# Patient Record
Sex: Male | Born: 1935 | Race: White | Hispanic: No | Smoking: Current every day smoker
Health system: Southern US, Community
[De-identification: ages and names within clinical notes are randomized; demographics above are authoritative.]

## PROBLEM LIST (undated history)

## (undated) DIAGNOSIS — J449 Chronic obstructive pulmonary disease, unspecified: Secondary | ICD-10-CM

## (undated) DIAGNOSIS — I4891 Unspecified atrial fibrillation: Secondary | ICD-10-CM

## (undated) DIAGNOSIS — I712 Thoracic aortic aneurysm, without rupture, unspecified: Secondary | ICD-10-CM

## (undated) DIAGNOSIS — I10 Essential (primary) hypertension: Secondary | ICD-10-CM

## (undated) DIAGNOSIS — R55 Syncope and collapse: Secondary | ICD-10-CM

## (undated) DIAGNOSIS — E119 Type 2 diabetes mellitus without complications: Secondary | ICD-10-CM

## (undated) DIAGNOSIS — R310 Gross hematuria: Secondary | ICD-10-CM

## (undated) DIAGNOSIS — H0012 Chalazion right lower eyelid: Secondary | ICD-10-CM

## (undated) DIAGNOSIS — I714 Abdominal aortic aneurysm, without rupture, unspecified: Secondary | ICD-10-CM

## (undated) HISTORY — DX: Abdominal aortic aneurysm, without rupture, unspecified: I71.40

## (undated) HISTORY — PX: HERNIA REPAIR: SHX51

## (undated) HISTORY — DX: Chronic obstructive pulmonary disease, unspecified: J44.9

## (undated) HISTORY — DX: Essential (primary) hypertension: I10

## (undated) HISTORY — DX: Type 2 diabetes mellitus without complications: E11.9

## (undated) HISTORY — DX: Gross hematuria: R31.0

## (undated) HISTORY — DX: Syncope and collapse: R55

## (undated) HISTORY — DX: Thoracic aortic aneurysm, without rupture, unspecified: I71.20

## (undated) HISTORY — DX: Thoracic aortic aneurysm, without rupture: I71.2

## (undated) HISTORY — DX: Unspecified atrial fibrillation: I48.91

## (undated) HISTORY — PX: KNEE ARTHROSCOPY: SUR90

## (undated) HISTORY — DX: Chalazion right lower eyelid: H00.12

## (undated) HISTORY — DX: Abdominal aortic aneurysm, without rupture: I71.4

---

## 1999-06-19 ENCOUNTER — Encounter: Admission: RE | Admit: 1999-06-19 | Discharge: 1999-06-19 | Payer: Self-pay | Admitting: Urology

## 1999-06-19 ENCOUNTER — Encounter: Payer: Self-pay | Admitting: Urology

## 2006-08-24 ENCOUNTER — Encounter: Admission: RE | Admit: 2006-08-24 | Discharge: 2006-08-24 | Payer: Self-pay | Admitting: Family Medicine

## 2006-09-05 ENCOUNTER — Encounter: Admission: RE | Admit: 2006-09-05 | Discharge: 2006-09-05 | Payer: Self-pay | Admitting: Family Medicine

## 2008-12-30 ENCOUNTER — Encounter: Admission: RE | Admit: 2008-12-30 | Discharge: 2008-12-30 | Payer: Self-pay | Admitting: Family Medicine

## 2010-05-14 ENCOUNTER — Encounter
Admission: RE | Admit: 2010-05-14 | Discharge: 2010-05-14 | Payer: Self-pay | Source: Home / Self Care | Attending: Family Medicine | Admitting: Family Medicine

## 2018-07-14 ENCOUNTER — Ambulatory Visit (INDEPENDENT_AMBULATORY_CARE_PROVIDER_SITE_OTHER): Payer: No Typology Code available for payment source | Admitting: Cardiovascular Disease

## 2018-07-14 ENCOUNTER — Encounter: Payer: Self-pay | Admitting: Cardiovascular Disease

## 2018-07-14 DIAGNOSIS — I714 Abdominal aortic aneurysm, without rupture, unspecified: Secondary | ICD-10-CM | POA: Insufficient documentation

## 2018-07-14 DIAGNOSIS — I482 Chronic atrial fibrillation, unspecified: Secondary | ICD-10-CM

## 2018-07-14 DIAGNOSIS — E782 Mixed hyperlipidemia: Secondary | ICD-10-CM | POA: Diagnosis not present

## 2018-07-14 DIAGNOSIS — Z720 Tobacco use: Secondary | ICD-10-CM

## 2018-07-14 DIAGNOSIS — I10 Essential (primary) hypertension: Secondary | ICD-10-CM | POA: Diagnosis not present

## 2018-07-14 DIAGNOSIS — E785 Hyperlipidemia, unspecified: Secondary | ICD-10-CM | POA: Insufficient documentation

## 2018-07-14 DIAGNOSIS — J449 Chronic obstructive pulmonary disease, unspecified: Secondary | ICD-10-CM | POA: Insufficient documentation

## 2018-07-14 NOTE — Patient Instructions (Addendum)
Medication Instructions:  Your physician recommends that you continue on your current medications as directed. Please refer to the Current Medication list given to you today.  If you need a refill on your cardiac medications before your next appointment, please call your pharmacy.   Lab work: NONE If you have labs (blood work) drawn today and your tests are completely normal, you will receive your results only by: Marland Kitchen MyChart Message (if you have MyChart) OR . A paper copy in the mail If you have any lab test that is abnormal or we need to change your treatment, we will call you to review the results.  Testing/Procedures: Your physician has recommended Non-Cardiac CT Angiography (CTA) which is a special type of CT scan that uses a computer to produce multi-dimensional views of major blood vessels throughout the body. In CT angiography, a contrast material is injected through an IV to help visualize the blood vessels. This scan will be used to visualize your Abdominal Aortic Aneurysm.   SCHEDULE December 2020  Follow-Up: At [facility], you and your health needs are our priority.  As part of our continuing mission to provide you with exceptional heart care, we have created designated Provider Care Teams.  These Care Teams include your primary Cardiologist (physician) and Advanced Practice Providers (APPs -  Physician Assistants and Nurse Practitioners) who all work together to provide you with the care you need, when you need it. . You will need a follow up appointment in 12 months.  Please call our office 2 months in advance to schedule this appointment.  You may see Dr. Gwenlyn Found or one of the following Advanced Practice Providers on your designated Care Team:   . Kerin Ransom, [place] . Almyra Deforest, PA-C . Fabian Sharp, PA-C . Jory Sims, DNP . Rosaria Ferries, PA-C . Roby Lofts, PA-C . Sande Rives, PA-C

## 2018-07-14 NOTE — Assessment & Plan Note (Signed)
History of hyperlipidemia on statin therapy with lipid profile performed by the [facility] 06/27/2018 revealing a total cholesterol 131, LDL 56 and HDL of 59.

## 2018-07-14 NOTE — Assessment & Plan Note (Signed)
History of abdominal aortic aneurysm by recent CT performed by his urologist 04/24/2018 measuring 4.5 x 4.2 cm.  This is not at the size where rupture is an issue but does require annual duplex ultrasound for follow-up.  He would potentially be amenable to endoluminal stent grafting if he got to the size that was appropriate.

## 2018-07-14 NOTE — Assessment & Plan Note (Signed)
Ongoing tobacco abuse of 1 pack/day for the last 70 years

## 2018-07-14 NOTE — Progress Notes (Signed)
07/14/2018 Phillip West   07-Jul-1929  [id number]  Primary Physician [name], MD Primary Cardiologist: Lorretta Harp MD Lupe Carney, [place]  HPI:  Phillip West is a 85 y.o. thin appearing widowed Caucasian male father of 1 son Phillip West, who is my patient as well) who was referred by the [facility] Windy Fast, MD) for cardiovascular evaluation because of an abdominal aortic aneurysm and A. fib.  He is retired from [employer] as well as the [employer] system.  His risk factors include 70 pack years of tobacco abuse currently smoking 1 pack/day, treated hypertension and hyperlipidemia.  There is no family history for heart disease.  He is never had a heart attack or stroke.  He denies chest pain but does get short of breath both at rest and with exertion probably related to COPD.  He just recently was noted to be in A. fib as well.  A CT scan performed 04/24/2018 by his urologist revealed an infrarenal abdominal aortic aneurysm measuring 4.5 x 4.2 cm.   Current Meds  Medication Sig  . aspirin EC 81 MG tablet Take 81 mg by mouth daily.  . Cholecalciferol (VITAMIN D) 50 MCG (2000 UT) tablet Take 1 tablet by mouth daily.  Marland Kitchen lisinopril (PRINIVIL,ZESTRIL) 5 MG tablet Take 1 tablet by mouth daily.  . LUBRICATING PLUS EYE DROPS 0.5 % SOLN Place 1 drop into both eyes daily.  . pravastatin (PRAVACHOL) 40 MG tablet Take 1 tablet by mouth daily.  . SYMBICORT 160-4.5 MCG/ACT inhaler Inhale 2 puffs into the lungs 2 (two) times daily.  . tamsulosin (FLOMAX) 0.4 MG CAPS capsule Take 0.4 mg by mouth daily.     No Known Allergies  Social History   Socioeconomic History  . Marital status: Widowed    Spouse name: Not on file  . Number of children: Not on file  . Years of education: Not on file  . Highest education level: Not on file  Occupational History  . Not on file  Social Needs  . Financial resource strain: Not on file    . Food insecurity:    Worry: Not on file    Inability: Not on file  . Transportation needs:    Medical: Not on file    Non-medical: Not on file  Tobacco Use  . Smoking status: Current Some Day Smoker    Packs/day: 1.00    Types: Cigarettes    Start date: 50  . Smokeless tobacco: Never Used  Substance and Sexual Activity  . Alcohol use: Not on file  . Drug use: Not on file  . Sexual activity: Not on file  Lifestyle  . Physical activity:    Days per week: Not on file    Minutes per session: Not on file  . Stress: Not on file  Relationships  . Social connections:    Talks on phone: Not on file    Gets together: Not on file    Attends religious service: Not on file    Active member of club or organization: Not on file    Attends meetings of clubs or organizations: Not on file    Relationship status: Not on file  . Intimate partner violence:    Fear of current or ex partner: Not on file    Emotionally abused: Not on file    Physically abused: Not on file    Forced sexual activity: Not on file  Other Topics Concern  . Not on file  Social History Narrative  . Not on file     Review of Systems: General: negative for chills, fever, night sweats or weight changes.  Cardiovascular: negative for chest pain, dyspnea on exertion, edema, orthopnea, palpitations, paroxysmal nocturnal dyspnea or shortness of breath Dermatological: negative for rash Respiratory: negative for cough or wheezing Urologic: negative for hematuria Abdominal: negative for nausea, vomiting, diarrhea, bright red blood per rectum, melena, or hematemesis Neurologic: negative for visual changes, syncope, or dizziness All other systems reviewed and are otherwise negative except as noted above.    Blood pressure 114/80, pulse 95, height 5' 8.5" (1.74 m), weight 158 lb 12.8 oz (72 kg).  General appearance: alert and no distress Neck: no adenopathy, no carotid bruit, no JVD, supple, symmetrical, trachea  midline and thyroid not enlarged, symmetric, no tenderness/mass/nodules Lungs: clear to auscultation bilaterally Heart: irregularly irregular rhythm Extremities: extremities normal, atraumatic, no cyanosis or edema Pulses: 2+ and symmetric Skin: Skin color, texture, turgor normal. No rashes or lesions Neurologic: Alert and oriented X 3, normal strength and tone. Normal symmetric reflexes. Normal coordination and gait  EKG atrial fibrillation with a ventricular spots of 95, left anterior fascicular block.  I personally reviewed this EKG  ASSESSMENT AND PLAN:   Essential hypertension History of essential hypertension her blood pressure measured today at 114/80.  He is on lisinopril  Hyperlipidemia History of hyperlipidemia on statin therapy with lipid profile performed by the [facility] 06/27/2018 revealing a total cholesterol 131, LDL 56 and HDL of 59.  Atrial fibrillation, chronic History of recently recognized atrial fibrillation not on oral anticoagulation at this time.  He would be a candidate for low-dose Eliquis.  He is rate controlled and is unaware that he is in A. fib.  Abdominal aortic aneurysm (AAA) ([place]) History of abdominal aortic aneurysm by recent CT performed by his urologist 04/24/2018 measuring 4.5 x 4.2 cm.  This is not at the size where rupture is an issue but does require annual duplex ultrasound for follow-up.  He would potentially be amenable to endoluminal stent grafting if he got to the size that was appropriate.  Tobacco abuse Ongoing tobacco abuse of 1 pack/day for the last 70 years  COPD (chronic obstructive pulmonary disease) (HCC) History of COPD on Symbicort with chronic shortness of breath at rest and dyspnea on exertion.  He is recalcitrant risk factor modification.      Lorretta Harp MD FACP,FACC,FAHA, [facility] 07/14/2018 9:09 AM

## 2018-07-14 NOTE — Assessment & Plan Note (Signed)
History of essential hypertension her blood pressure measured today at 114/80.  He is on lisinopril

## 2018-07-14 NOTE — Assessment & Plan Note (Signed)
History of recently recognized atrial fibrillation not on oral anticoagulation at this time.  He would be a candidate for low-dose Eliquis.  He is rate controlled and is unaware that he is in A. fib.

## 2018-07-14 NOTE — Assessment & Plan Note (Signed)
History of COPD on Symbicort with chronic shortness of breath at rest and dyspnea on exertion.  He is recalcitrant risk factor modification.

## 2018-07-21 ENCOUNTER — Telehealth: Payer: Self-pay | Admitting: Cardiovascular Disease

## 2018-07-21 NOTE — Telephone Encounter (Signed)
° ° °  [place] requesting  2/28 ov note - Fax [phone] Phone [phone]

## 2019-04-09 ENCOUNTER — Encounter: Payer: Self-pay | Admitting: *Deleted

## 2019-04-23 ENCOUNTER — Encounter: Payer: Non-veteran care | Admitting: Cardiothoracic Surgery

## 2019-04-23 ENCOUNTER — Other Ambulatory Visit: Payer: Medicare Other

## 2019-04-24 ENCOUNTER — Other Ambulatory Visit: Payer: Self-pay

## 2019-04-24 DIAGNOSIS — I714 Abdominal aortic aneurysm, without rupture, unspecified: Secondary | ICD-10-CM

## 2019-04-26 ENCOUNTER — Institutional Professional Consult (permissible substitution) (INDEPENDENT_AMBULATORY_CARE_PROVIDER_SITE_OTHER): Payer: No Typology Code available for payment source | Admitting: Cardiothoracic Surgery

## 2019-04-26 ENCOUNTER — Other Ambulatory Visit: Payer: Self-pay

## 2019-04-26 ENCOUNTER — Ambulatory Visit (HOSPITAL_COMMUNITY)
Admission: RE | Admit: 2019-04-26 | Discharge: 2019-04-26 | Disposition: A | Discharge status: Home / Self Care | Payer: Medicare Other | Source: Ambulatory Visit | Attending: Vascular Surgery | Admitting: Vascular Surgery

## 2019-04-26 VITALS — BP 129/78 | HR 87 | Temp 96.4°F | Resp 16 | Ht 68.5 in | Wt 150.0 lb

## 2019-04-26 DIAGNOSIS — I712 Thoracic aortic aneurysm, without rupture, unspecified: Secondary | ICD-10-CM | POA: Insufficient documentation

## 2019-04-26 DIAGNOSIS — R310 Gross hematuria: Secondary | ICD-10-CM | POA: Insufficient documentation

## 2019-04-26 DIAGNOSIS — H0012 Chalazion right lower eyelid: Secondary | ICD-10-CM | POA: Insufficient documentation

## 2019-04-26 DIAGNOSIS — I10 Essential (primary) hypertension: Secondary | ICD-10-CM | POA: Insufficient documentation

## 2019-04-26 DIAGNOSIS — E119 Type 2 diabetes mellitus without complications: Secondary | ICD-10-CM | POA: Insufficient documentation

## 2019-04-26 DIAGNOSIS — I714 Abdominal aortic aneurysm, without rupture, unspecified: Secondary | ICD-10-CM

## 2019-04-26 DIAGNOSIS — I4891 Unspecified atrial fibrillation: Secondary | ICD-10-CM | POA: Insufficient documentation
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
## 2019-04-26 NOTE — Patient Instructions (Signed)

## 2019-04-27 ENCOUNTER — Other Ambulatory Visit (HOSPITAL_COMMUNITY): Payer: Non-veteran care

## 2019-04-27 ENCOUNTER — Ambulatory Visit (INDEPENDENT_AMBULATORY_CARE_PROVIDER_SITE_OTHER): Payer: No Typology Code available for payment source | Admitting: Vascular Surgery

## 2019-04-27 ENCOUNTER — Encounter: Payer: Self-pay | Admitting: Vascular Surgery

## 2019-04-27 VITALS — BP 125/80 | HR 97 | Temp 97.6°F | Resp 20 | Ht 68.5 in | Wt 156.0 lb

## 2019-04-27 DIAGNOSIS — I714 Abdominal aortic aneurysm, without rupture, unspecified: Secondary | ICD-10-CM

## 2019-04-27 NOTE — Progress Notes (Signed)
Patient ID: Phillip West, male   DOB: May 25, 1929, 85 y.o.   MRN: [id number]  Reason for Consult: New Patient (Initial Visit)   Referred by [name], MD  Subjective:     HPI:  Phillip West is a 85 y.o. male known history of abdominal and thoracic aneurysm.  He is a current and long-term smoker with COPD and diabetes as well as hypertension.  He does have 1 son who has multiple medical problems he is unsure if he is ever been a smoker unsure if he has aneurysm disease.  No new back or abdominal pain.  No limitations to his walking at this time.  Does take aspirin a statin drug daily.  Past Medical History:  Diagnosis Date  . Abdominal aortic aneurysm (AAA) ([facility])   . Atrial fibrillation ([place])   . Chalazion right lower eyelid   . COPD (chronic obstructive pulmonary disease) ([place])   . Diabetes mellitus ([place])   . Gross hematuria   . Hypertension   . Syncope and collapse   . Thoracic aortic aneurysm (TAA) ([facility])    Family History  Problem Relation Age of Onset  . Heart disease Father   . Stroke Father    Past Surgical History:  Procedure Laterality Date  . HERNIA REPAIR    . KNEE ARTHROSCOPY      Short Social History:  Social History   Tobacco Use  . Smoking status: Current Some Day Smoker    Packs/day: 1.00    Types: Cigarettes    Start date: 63  . Smokeless tobacco: Never Used  Substance Use Topics  . Alcohol use: Not Currently    Allergies  Allergen Reactions  . Quinolones     Patient was warned about not using Cipro and similar antibiotics. Recent studies have raised concern that fluoroquinolone antibiotics could be associated with an increased risk of aortic aneurysm Fluoroquinolones have non-antimicrobial properties that might jeopardise the integrity of the extracellular matrix of the vascular wall In a  propensity score matched cohort study in [place], there was a 66% increased rate of aortic aneurysm or dissection associated with oral  fluoroquinolone use, compared wit    Current Outpatient Medications  Medication Sig Dispense Refill  . aspirin EC 81 MG tablet Take 81 mg by mouth daily.    . Cholecalciferol (VITAMIN D) 50 MCG (2000 UT) tablet Take 1 tablet by mouth daily.    Marland Kitchen lisinopril (PRINIVIL,ZESTRIL) 5 MG tablet Take 2.5 mg by mouth daily.     . LUBRICATING PLUS EYE DROPS 0.5 % SOLN Place 1 drop into both eyes daily.    . pravastatin (PRAVACHOL) 40 MG tablet Take 1 tablet by mouth daily.    . SYMBICORT 160-4.5 MCG/ACT inhaler Inhale 2 puffs into the lungs 2 (two) times daily.    . tamsulosin (FLOMAX) 0.4 MG CAPS capsule Take 0.4 mg by mouth daily.     No current facility-administered medications for this visit.    Review of Systems  Constitutional:  Constitutional negative. HENT: HENT negative.  Eyes: Eyes negative.  Respiratory: Respiratory negative.  Cardiovascular: Cardiovascular negative.  GI: Gastrointestinal negative.  Musculoskeletal: Musculoskeletal negative.  Neurological: Neurological negative. Hematologic: Hematologic/lymphatic negative.  Psychiatric: Psychiatric negative.        Objective:  Objective   Vitals:   04/27/19 0853  BP: 125/80  Pulse: 97  Resp: 20  Temp: 97.6 F (36.4 C)  SpO2: 97%  Weight: 156 lb (70.8 kg)  Height: 5' 8.5" (1.74 m)  Body mass index is 23.37 kg/m.  Physical Exam HENT:     Head: Normocephalic.     Nose: Nose normal.     Mouth/Throat:     Mouth: Mucous membranes are moist.  Eyes:     Pupils: Pupils are equal, round, and reactive to light.  Cardiovascular:     Rate and Rhythm: Normal rate.     Pulses:          Carotid pulses are 2+ on the right side and 2+ on the left side.      Femoral pulses are 2+ on the right side and 2+ on the left side.      Popliteal pulses are 3+ on the right side and 3+ on the left side.       Dorsalis pedis pulses are 2+ on the right side and 2+ on the left side.  Pulmonary:     Effort: Pulmonary effort is normal.   Abdominal:     Palpations: Abdomen is soft.  Musculoskeletal:        General: No swelling. Normal range of motion.  Skin:    General: Skin is warm and dry.     Capillary Refill: Capillary refill takes less than 2 seconds.  Neurological:     General: No focal deficit present.     Mental Status: He is alert.  Psychiatric:        Mood and Affect: Mood normal.        Behavior: Behavior normal.        Thought Content: Thought content normal.        Judgment: Judgment normal.     Data: I have reviewed his aortic duplex from yesterday which demonstrates 4.4 cm infrarenal aneurysm.     Assessment/Plan:     85 year old male with 4.4 cm infrarenal aneurysm.  He appropriately would not want any procedures unless absolutely necessary and I discussed the need for consideration of repair 5.5 cm.  At current size we can follow-up yearly with duplex ultrasound.  I will also obtain duplex of his popliteal arteries given the increase pulsatility found bilaterally without symptoms in an 85 year old male.  States that he will continue to smoke although I counseled him on this today.  He continues on aspirin and statin.     Waynetta Sandy MD [facility]

## 2019-05-01 ENCOUNTER — Other Ambulatory Visit: Payer: Self-pay

## 2019-05-02 ENCOUNTER — Other Ambulatory Visit: Payer: Self-pay | Admitting: Cardiothoracic Surgery

## 2019-05-02 DIAGNOSIS — I712 Thoracic aortic aneurysm, without rupture, unspecified: Secondary | ICD-10-CM

## 2019-05-09 ENCOUNTER — Ambulatory Visit
Admission: RE | Admit: 2019-05-09 | Discharge: 2019-05-09 | Disposition: A | Discharge status: Home / Self Care | Payer: Self-pay | Source: Ambulatory Visit | Attending: Cardiothoracic Surgery | Admitting: Cardiothoracic Surgery

## 2019-05-09 ENCOUNTER — Other Ambulatory Visit (HOSPITAL_COMMUNITY): Payer: Self-pay | Admitting: Cardiothoracic Surgery

## 2019-05-09 DIAGNOSIS — Z01818 Encounter for other preprocedural examination: Secondary | ICD-10-CM

## 2019-05-16 ENCOUNTER — Other Ambulatory Visit: Payer: Self-pay | Admitting: *Deleted

## 2019-05-16 DIAGNOSIS — I714 Abdominal aortic aneurysm, without rupture, unspecified: Secondary | ICD-10-CM

## 2019-06-14 ENCOUNTER — Other Ambulatory Visit: Payer: Self-pay | Admitting: Cardiothoracic Surgery

## 2019-06-14 DIAGNOSIS — I712 Thoracic aortic aneurysm, without rupture, unspecified: Secondary | ICD-10-CM

## 2019-06-15 LAB — CREATININE, SERUM: Creat: 0.87 mg/dL (ref 0.70–1.11)

## 2019-06-21 ENCOUNTER — Ambulatory Visit
Admission: RE | Admit: 2019-06-21 | Discharge: 2019-06-21 | Disposition: A | Discharge status: Home / Self Care | Payer: Medicare Other | Source: Ambulatory Visit | Attending: Cardiothoracic Surgery | Admitting: Cardiothoracic Surgery

## 2019-06-21 ENCOUNTER — Ambulatory Visit: Payer: No Typology Code available for payment source | Admitting: Cardiothoracic Surgery

## 2019-06-21 DIAGNOSIS — I712 Thoracic aortic aneurysm, without rupture, unspecified: Secondary | ICD-10-CM

## 2019-06-21 MED ORDER — IOPAMIDOL (ISOVUE-370) INJECTION 76%
75.0000 mL | Freq: Once | INTRAVENOUS | Status: AC | PRN
  Administered 2019-06-21: 75 mL via INTRAVENOUS
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
## 2019-06-22 ENCOUNTER — Ambulatory Visit (INDEPENDENT_AMBULATORY_CARE_PROVIDER_SITE_OTHER): Payer: No Typology Code available for payment source | Admitting: Cardiothoracic Surgery

## 2019-06-22 ENCOUNTER — Encounter: Payer: Self-pay | Admitting: Cardiothoracic Surgery

## 2019-06-22 ENCOUNTER — Other Ambulatory Visit: Payer: Self-pay

## 2019-06-22 VITALS — BP 114/65 | HR 80 | Resp 20 | Ht 68.5 in | Wt 155.0 lb

## 2019-06-22 DIAGNOSIS — I714 Abdominal aortic aneurysm, without rupture, unspecified: Secondary | ICD-10-CM

## 2019-06-22 DIAGNOSIS — I712 Thoracic aortic aneurysm, without rupture, unspecified: Secondary | ICD-10-CM
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
## 2019-06-22 NOTE — Patient Instructions (Signed)

## 2019-09-18 ENCOUNTER — Other Ambulatory Visit: Payer: Self-pay

## 2019-09-18 ENCOUNTER — Ambulatory Visit (INDEPENDENT_AMBULATORY_CARE_PROVIDER_SITE_OTHER): Payer: No Typology Code available for payment source | Admitting: Cardiothoracic Surgery

## 2019-09-18 ENCOUNTER — Encounter: Payer: Self-pay | Admitting: Cardiothoracic Surgery

## 2019-09-18 VITALS — BP 114/62 | HR 98 | Temp 98.2°F | Resp 18 | Ht 68.5 in | Wt 153.8 lb

## 2019-09-18 DIAGNOSIS — I712 Thoracic aortic aneurysm, without rupture, unspecified: Secondary | ICD-10-CM
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
## 2020-02-06 ENCOUNTER — Other Ambulatory Visit: Payer: Self-pay | Admitting: Cardiothoracic Surgery

## 2020-02-06 DIAGNOSIS — I712 Thoracic aortic aneurysm, without rupture, unspecified: Secondary | ICD-10-CM

## 2020-03-20 ENCOUNTER — Other Ambulatory Visit: Payer: No Typology Code available for payment source

## 2020-03-20 ENCOUNTER — Ambulatory Visit: Payer: No Typology Code available for payment source | Admitting: Cardiothoracic Surgery

## 2020-03-27 ENCOUNTER — Encounter: Payer: Self-pay | Admitting: Cardiothoracic Surgery

## 2020-03-27 ENCOUNTER — Ambulatory Visit (INDEPENDENT_AMBULATORY_CARE_PROVIDER_SITE_OTHER): Payer: No Typology Code available for payment source | Admitting: Cardiothoracic Surgery

## 2020-03-27 ENCOUNTER — Ambulatory Visit
Admission: RE | Admit: 2020-03-27 | Discharge: 2020-03-27 | Disposition: A | Discharge status: Home / Self Care | Payer: Medicare Other | Source: Ambulatory Visit | Attending: Cardiothoracic Surgery | Admitting: Cardiothoracic Surgery

## 2020-03-27 ENCOUNTER — Other Ambulatory Visit: Payer: Self-pay

## 2020-03-27 VITALS — BP 124/68 | HR 74 | Resp 18 | Wt 157.6 lb

## 2020-03-27 DIAGNOSIS — I712 Thoracic aortic aneurysm, without rupture, unspecified: Secondary | ICD-10-CM

## 2020-03-27 MED ORDER — IOPAMIDOL (ISOVUE-370) INJECTION 76%
75.0000 mL | Freq: Once | INTRAVENOUS | Status: AC | PRN
  Administered 2020-03-27: 75 mL via INTRAVENOUS
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
## 2020-04-25 ENCOUNTER — Emergency Department (HOSPITAL_COMMUNITY): Payer: No Typology Code available for payment source

## 2020-04-25 ENCOUNTER — Other Ambulatory Visit: Payer: Self-pay

## 2020-04-25 ENCOUNTER — Inpatient Hospital Stay (HOSPITAL_COMMUNITY)
Admission: EM | Admit: 2020-04-25 | Discharge: 2020-05-01 | DRG: 481 | Disposition: A | Discharge status: Rehabilitation Facility | Payer: No Typology Code available for payment source | Attending: Internal Medicine | Admitting: Internal Medicine

## 2020-04-25 DIAGNOSIS — S72142A Displaced intertrochanteric fracture of left femur, initial encounter for closed fracture: Secondary | ICD-10-CM | POA: Diagnosis not present

## 2020-04-25 DIAGNOSIS — D7589 Other specified diseases of blood and blood-forming organs: Secondary | ICD-10-CM | POA: Diagnosis present

## 2020-04-25 DIAGNOSIS — Z720 Tobacco use: Secondary | ICD-10-CM | POA: Diagnosis present

## 2020-04-25 DIAGNOSIS — Z20822 Contact with and (suspected) exposure to covid-19: Secondary | ICD-10-CM | POA: Diagnosis present

## 2020-04-25 DIAGNOSIS — K5900 Constipation, unspecified: Secondary | ICD-10-CM | POA: Diagnosis present

## 2020-04-25 DIAGNOSIS — Z7951 Long term (current) use of inhaled steroids: Secondary | ICD-10-CM

## 2020-04-25 DIAGNOSIS — S72002A Fracture of unspecified part of neck of left femur, initial encounter for closed fracture: Secondary | ICD-10-CM

## 2020-04-25 DIAGNOSIS — W010XXA Fall on same level from slipping, tripping and stumbling without subsequent striking against object, initial encounter: Secondary | ICD-10-CM | POA: Diagnosis present

## 2020-04-25 DIAGNOSIS — Z79899 Other long term (current) drug therapy: Secondary | ICD-10-CM

## 2020-04-25 DIAGNOSIS — J449 Chronic obstructive pulmonary disease, unspecified: Secondary | ICD-10-CM | POA: Diagnosis present

## 2020-04-25 DIAGNOSIS — I700 Atherosclerosis of aorta: Secondary | ICD-10-CM | POA: Diagnosis present

## 2020-04-25 DIAGNOSIS — N281 Cyst of kidney, acquired: Secondary | ICD-10-CM | POA: Diagnosis present

## 2020-04-25 DIAGNOSIS — I482 Chronic atrial fibrillation, unspecified: Secondary | ICD-10-CM | POA: Diagnosis not present

## 2020-04-25 DIAGNOSIS — Z7982 Long term (current) use of aspirin: Secondary | ICD-10-CM

## 2020-04-25 DIAGNOSIS — I714 Abdominal aortic aneurysm, without rupture: Secondary | ICD-10-CM | POA: Diagnosis present

## 2020-04-25 DIAGNOSIS — R310 Gross hematuria: Secondary | ICD-10-CM | POA: Diagnosis present

## 2020-04-25 DIAGNOSIS — D72829 Elevated white blood cell count, unspecified: Secondary | ICD-10-CM | POA: Diagnosis not present

## 2020-04-25 DIAGNOSIS — Z8249 Family history of ischemic heart disease and other diseases of the circulatory system: Secondary | ICD-10-CM | POA: Diagnosis not present

## 2020-04-25 DIAGNOSIS — I2510 Atherosclerotic heart disease of native coronary artery without angina pectoris: Secondary | ICD-10-CM | POA: Diagnosis present

## 2020-04-25 DIAGNOSIS — I951 Orthostatic hypotension: Secondary | ICD-10-CM

## 2020-04-25 DIAGNOSIS — R197 Diarrhea, unspecified: Secondary | ICD-10-CM | POA: Diagnosis present

## 2020-04-25 DIAGNOSIS — I4891 Unspecified atrial fibrillation: Secondary | ICD-10-CM | POA: Diagnosis not present

## 2020-04-25 DIAGNOSIS — F17210 Nicotine dependence, cigarettes, uncomplicated: Secondary | ICD-10-CM | POA: Diagnosis present

## 2020-04-25 DIAGNOSIS — I712 Thoracic aortic aneurysm, without rupture, unspecified: Secondary | ICD-10-CM | POA: Diagnosis present

## 2020-04-25 DIAGNOSIS — I4821 Permanent atrial fibrillation: Secondary | ICD-10-CM | POA: Diagnosis not present

## 2020-04-25 DIAGNOSIS — T148XXA Other injury of unspecified body region, initial encounter: Secondary | ICD-10-CM

## 2020-04-25 DIAGNOSIS — Y92017 Garden or yard in single-family (private) house as the place of occurrence of the external cause: Secondary | ICD-10-CM

## 2020-04-25 DIAGNOSIS — S7292XA Unspecified fracture of left femur, initial encounter for closed fracture: Secondary | ICD-10-CM | POA: Diagnosis present

## 2020-04-25 DIAGNOSIS — I10 Essential (primary) hypertension: Secondary | ICD-10-CM | POA: Diagnosis not present

## 2020-04-25 DIAGNOSIS — Z823 Family history of stroke: Secondary | ICD-10-CM | POA: Diagnosis not present

## 2020-04-25 DIAGNOSIS — W19XXXA Unspecified fall, initial encounter: Secondary | ICD-10-CM

## 2020-04-25 DIAGNOSIS — Z881 Allergy status to other antibiotic agents status: Secondary | ICD-10-CM | POA: Diagnosis not present

## 2020-04-25 DIAGNOSIS — E785 Hyperlipidemia, unspecified: Secondary | ICD-10-CM | POA: Diagnosis present

## 2020-04-25 DIAGNOSIS — E119 Type 2 diabetes mellitus without complications: Secondary | ICD-10-CM | POA: Diagnosis present

## 2020-04-25 LAB — BASIC METABOLIC PANEL
Anion gap: 9 (ref 5–15)
BUN: 13 mg/dL (ref 8–23)
CO2: 24 mmol/L (ref 22–32)
Calcium: 9.4 mg/dL (ref 8.9–10.3)
Chloride: 105 mmol/L (ref 98–111)
Creatinine, Ser: 0.88 mg/dL (ref 0.61–1.24)
GFR, Estimated: >60 mL/min (ref >60)
Glucose, Bld: 112 mg/dL — ABNORMAL HIGH (ref 70–99)
Potassium: 4.3 mmol/L (ref 3.5–5.1)
Sodium: 138 mmol/L (ref 135–145)

## 2020-04-25 LAB — HEMOGLOBIN A1C
Hgb A1c MFr Bld: 6.4 % — ABNORMAL HIGH (ref 4.8–5.6)
Mean Plasma Glucose: 136.98 mg/dL

## 2020-04-25 LAB — PROTIME-INR
INR: 1.1 (ref 0.8–1.2)
Prothrombin Time: 13.9 seconds (ref 11.4–15.2)

## 2020-04-25 LAB — CBC WITH DIFFERENTIAL/PLATELET
Abs Immature Granulocytes: 0.07 10*3/uL (ref 0.00–0.07)
Basophils Absolute: 0.1 10*3/uL (ref 0.0–0.1)
Basophils Relative: 1 %
Eosinophils Absolute: 0.2 10*3/uL (ref 0.0–0.5)
Eosinophils Relative: 2 %
HCT: 42 % (ref 39.0–52.0)
Hemoglobin: 13.1 g/dL (ref 13.0–17.0)
Immature Granulocytes: 1 %
Lymphocytes Relative: 13 %
Lymphs Abs: 1.3 10*3/uL (ref 0.7–4.0)
MCH: 31.7 pg (ref 26.0–34.0)
MCHC: 31.2 g/dL (ref 30.0–36.0)
MCV: 101.7 fL — ABNORMAL HIGH (ref 80.0–100.0)
Monocytes Absolute: 0.9 10*3/uL (ref 0.1–1.0)
Monocytes Relative: 9 %
Neutro Abs: 7.7 10*3/uL (ref 1.7–7.7)
Neutrophils Relative %: 74 %
Platelets: 123 10*3/uL — ABNORMAL LOW (ref 150–400)
RBC: 4.13 MIL/uL — ABNORMAL LOW (ref 4.22–5.81)
RDW: 13.7 % (ref 11.5–15.5)
WBC: 10.2 10*3/uL (ref 4.0–10.5)
nRBC: 0 % (ref 0.0–0.2)

## 2020-04-25 LAB — CBG MONITORING, ED: Glucose-Capillary: 112 mg/dL — ABNORMAL HIGH (ref 70–99)

## 2020-04-25 LAB — RESP PANEL BY RT-PCR (FLU A&B, COVID) ARPGX2
Influenza A by PCR: NEGATIVE
Influenza B by PCR: NEGATIVE
SARS Coronavirus 2 by RT PCR: NEGATIVE

## 2020-04-25 LAB — ABO/RH: ABO/RH(D): A POS

## 2020-04-25 LAB — TYPE AND SCREEN
ABO/RH(D): A POS
Antibody Screen: NEGATIVE

## 2020-04-25 MED ORDER — INSULIN ASPART 100 UNIT/ML ~~LOC~~ SOLN
0.0000 [IU] | Freq: Three times a day (TID) | SUBCUTANEOUS | Status: DC
  Administered 2020-04-26: 17:00:00 1 [IU] via SUBCUTANEOUS
  Administered 2020-04-27 – 2020-04-28 (×2): 2 [IU] via SUBCUTANEOUS
  Administered 2020-04-29: 09:00:00 1 [IU] via SUBCUTANEOUS
  Administered 2020-04-29 – 2020-04-30 (×2): 2 [IU] via SUBCUTANEOUS

## 2020-04-25 MED ORDER — CARBOXYMETHYLCELLULOSE SOD PF 0.5 % OP SOLN: 1.0000 [drp] | Freq: Every day | OPHTHALMIC | Status: DC

## 2020-04-25 MED ORDER — MOMETASONE FURO-FORMOTEROL FUM 200-5 MCG/ACT IN AERO
2.0000 | INHALATION_SPRAY | Freq: Two times a day (BID) | RESPIRATORY_TRACT | Status: DC
  Administered 2020-04-26 – 2020-05-01 (×7): 2 via RESPIRATORY_TRACT
  Filled 2020-04-25 (×2): qty 8.8

## 2020-04-25 MED ORDER — ACETAMINOPHEN 325 MG PO TABS
650.0000 mg | ORAL_TABLET | Freq: Four times a day (QID) | ORAL | Status: DC | PRN
  Administered 2020-04-26 – 2020-04-27 (×3): 650 mg via ORAL
  Filled 2020-04-25 (×4): qty 2

## 2020-04-25 MED ORDER — ACETAMINOPHEN 650 MG RE SUPP: 650.0000 mg | Freq: Four times a day (QID) | RECTAL | Status: DC | PRN

## 2020-04-25 MED ORDER — METOPROLOL TARTRATE 12.5 MG HALF TABLET
12.5000 mg | ORAL_TABLET | Freq: Two times a day (BID) | ORAL | Status: DC
  Administered 2020-04-25 – 2020-04-28 (×5): 12.5 mg via ORAL
  Filled 2020-04-25 (×6): qty 1

## 2020-04-25 MED ORDER — HEPARIN SODIUM (PORCINE) 5000 UNIT/ML IJ SOLN
5000.0000 [IU] | Freq: Three times a day (TID) | INTRAMUSCULAR | Status: AC
  Administered 2020-04-25: 5000 [IU] via SUBCUTANEOUS
  Filled 2020-04-25: qty 1

## 2020-04-25 MED ORDER — HYDROMORPHONE HCL 1 MG/ML IJ SOLN
0.5000 mg | INTRAMUSCULAR | Status: DC | PRN
  Administered 2020-04-25: 0.5 mg via INTRAVENOUS
  Filled 2020-04-25: qty 1

## 2020-04-25 MED ORDER — HEPARIN SODIUM (PORCINE) 5000 UNIT/ML IJ SOLN
5000.0000 [IU] | Freq: Three times a day (TID) | INTRAMUSCULAR | Status: DC
Start: 2020-04-25 — End: 2020-04-25

## 2020-04-25 MED ORDER — SODIUM CHLORIDE 0.9% FLUSH
3.0000 mL | Freq: Two times a day (BID) | INTRAVENOUS | Status: DC
  Administered 2020-04-25 – 2020-05-01 (×10): 3 mL via INTRAVENOUS

## 2020-04-25 MED ORDER — TAMSULOSIN HCL 0.4 MG PO CAPS
0.4000 mg | ORAL_CAPSULE | Freq: Every day | ORAL | Status: DC
  Administered 2020-04-26 – 2020-04-27 (×2): 0.4 mg via ORAL
  Filled 2020-04-25 (×3): qty 1

## 2020-04-25 MED ORDER — POLYETHYLENE GLYCOL 3350 17 G PO PACK: 17.0000 g | PACK | Freq: Every day | ORAL | Status: DC | PRN

## 2020-04-25 MED ORDER — POLYVINYL ALCOHOL 1.4 % OP SOLN
1.0000 [drp] | Freq: Every day | OPHTHALMIC | Status: DC
  Administered 2020-04-27 – 2020-04-30 (×4): 1 [drp] via OPHTHALMIC
  Filled 2020-04-25: qty 15

## 2020-04-25 MED ORDER — ALBUTEROL SULFATE HFA 108 (90 BASE) MCG/ACT IN AERS
1.0000 | INHALATION_SPRAY | Freq: Four times a day (QID) | RESPIRATORY_TRACT | Status: DC | PRN
  Filled 2020-04-25: qty 6.7

## 2020-04-25 NOTE — ED Notes (Signed)
Pt resting in bed. Maintaining hip fracture precautions. Provided patient with food.

## 2020-04-25 NOTE — Progress Notes (Signed)
CT scan was completed of the left hip.  I reviewed the images and the patient has an intertrochanteric hip fracture that is amenable for cephalomedullary fixation.  I have posted for surgery tomorrow morning.  Keep patient n.p.o. past midnight please.

## 2020-04-25 NOTE — ED Triage Notes (Signed)
Pt transported from home by [facility] from after mechanical fall in his yard, pt states he was unable to get up d/t leg pain for @ 1 hr, pt unable to get in touch with his son. GCS 15, no blood thinners. No shortening or rotation

## 2020-04-25 NOTE — H&P (Signed)
Date: 04/25/2020               Patient Name:  Phillip West MRN: [id number]  DOB: 1930/01/30 Age / Sex: 86 y.o., male   PCP: Windy Fast, MD         Medical Service: Internal Medicine Teaching Service         Attending Physician: Dr. Lucious Groves, DO    First Contact: Dr. Demaris Callander Pager: [phone]  Second Contact: Dr. Charleen Kirks Pager: [phone]       After Hours (After 5p/  First Contact Pager: [phone]  weekends / holidays): Second Contact Pager: [phone]   Chief Complaint: Fall   History of Present Illness:   Phillip West is a 86 y/o male with a PMHx of thoracic and abdominal aortic aneurysms, COPD, A. Fib not on AC, T2DM who presents to the ED s/p mechanical fall.   Phillip West states he was crushing leaves with his tractor at home when he felt possibly slightly dizzy and lost his footing when stepping down his tractor steps. He slipped forwards and landed on his left hip and buttocks. Since then, he has been unable to bear any weight. Any movement above the left ankle causes significant pain. He denies any LOC or head trauma. He denies any fever, chills, N/V/D, chest pain. He notes a chronic cough and SOB that is stable and unchanged; it is something he sees his PCP for and is attributed to his continued smoking and COPD. He denies any new medication changes.   ED Course:  Patient arrived to the ED via ambulance.  On arrival, vitals showed blood pressure 124/80, pulse of 67, with oxygen saturation 97% at 20 respirations per minute.  Initial CBC showed borderline leukocytosis at 10.2, normal hemoglobin of 13.1 with mild macrocytosis at one 1.7.  Platelets are mildly low at 123.  BMP stable with no electrolyte abnormalities or renal dysfunction.  Left hip x-ray obtained shows acute fracture of the proximal left femur distal neck with comminution into the intertrochanteric segment.  Orthopedic surgery was consulted by the ED provider who recommended a CT.  CT shows acute,  impacted, and comminuted intertrochanteric left femoral fracture with partially visualized known aortic aneurysm.  Meds:  Aspirin 81 mg daily Pravastatin 40 mg daily Tamsulosin 0.4 mg  Vitamin D-3 Symbicort Metoprolol 12.5 mg BID   Allergies: Allergies as of 04/25/2020 - Review Complete 04/25/2020  Allergen Reaction Noted  . Quinolones  04/26/2019   Past Medical History:  Diagnosis Date  . Abdominal aortic aneurysm (AAA) ([place])   . Atrial fibrillation ([place])   . Chalazion right lower eyelid   . COPD (chronic obstructive pulmonary disease) ([place])   . Diabetes mellitus ([place])   . Gross hematuria   . Hypertension   . Syncope and collapse   . Thoracic aortic aneurysm (TAA) ([place])    Family History:  Family History  Problem Relation Age of Onset  . Heart disease Father   . Stroke Father    Social History:  Daily tobacco user, approximately 1 pack/day for the last 75 years.  Denies any daily alcohol use. Denies any drug use Lives in [place] with his son Retired veteran  Review of Systems: A complete ROS was negative except as per HPI.   Physical Exam: Blood pressure (!) 142/70, pulse 81, temperature 97.7 F (36.5 C), temperature source Oral, resp. rate (!) 24, height 5\' 8"  (1.727 m), weight 68 kg, SpO2 97 %.  Physical Exam  Vitals and nursing note reviewed.  Constitutional:      Appearance: He is normal weight.  HENT:     Head: Normocephalic and atraumatic.  Eyes:     Extraocular Movements: Extraocular movements intact.     Conjunctiva/sclera: Conjunctivae normal.     Pupils: Pupils are equal, round, and reactive to light.  Cardiovascular:     Rate and Rhythm: Normal rate. Rhythm irregularly irregular.     Pulses:          Dorsalis pedis pulses are 2+ on the right side and 2+ on the left side.     Heart sounds: No murmur heard.   Pulmonary:     Effort: Pulmonary effort is normal. No respiratory distress.     Breath sounds: No wheezing, rhonchi or rales.   Abdominal:     General: Bowel sounds are normal. There is no distension.     Palpations: Abdomen is soft. There is no mass.     Tenderness: There is no abdominal tenderness. There is no guarding.  Musculoskeletal:     Comments: Left leg externally rotated and minimally shorter than the right.  Unable to assess range of motion due to pain.  Left ankle and foot without any deformities.  Right entire leg without any acute deformities.  Skin:    General: Skin is warm and dry.     Findings: Bruising (right elbow, large ecchymosis) present.  Neurological:     Mental Status: He is alert and oriented to person, place, and time. Mental status is at baseline.     Motor: No weakness.  Psychiatric:        Mood and Affect: Mood normal.        Behavior: Behavior normal.        Thought Content: Thought content normal.        Judgment: Judgment normal.    EKG: personally reviewed my interpretation is: Atrial fibrillation with left axis deviation. Unchanged from prior EKG in 2020.   Assessment & Plan by Problem: Active Problems:   Femur fracture, left [facility])  [name] is a 86 y/o male with a PMHx of thoracic and abdominal aortic aneurysms, COPD, A. Fib not on AC, T2DM who presents to the ED s/p mechanical fall currently admitted for left femur fracture.   # Left Femur Fracture:  Secondary to mechanical fall although patient states he could possibly have been dizzy.   - Orthopedic surgery consulted; surgery likely tomorrow - Dilaudid 0.5 q4h PRN for severe pain - NPO at [facility] - Heparin held at [facility]  # A. Fib, rate controlled  Likely permanent A. Fib, although previous records unable to be obtained. He is rate controlled on Metoprolol. Per chart review, he is not on [facility] due to hematuria.   - Continue home Metoprolol 12.5 mg BID  # T2DM  A1c today 6.4%. Not on any glycemic agents at home.   - SSI (sensitive)   # COPD  No wheezing on examination today. Symbicort daily at home with  Albuterol PRN  - Dulera BID  - Albuterol PRN   # Thoracic and Aortic Aneurysm  Ascending aortic aneurysm 5.3 cm with infrarenal AAA estimated 4.4 cm.  Follows with Dr. Servando Snare with [facility]. Recent follow up in November 2021, decision was made to hold off on surgery due to pulmonary disease. No acute chest pain or abdominal pain at this time. No additional intervention at this time.   Dispo: Admit patient to Inpatient with expected length of  stay greater than 2 midnights.  Signed: Dr. Jose Persia Internal Medicine PGY-2  Pager: [phone] After 5pm on weekdays and 1pm on weekends: On Call pager [phone]  04/25/2020, 7:02 PM

## 2020-04-25 NOTE — ED Notes (Signed)
Son Shia Eber) would like a call from surgeon in the morning if he is not here yet. Phone number is [phone]

## 2020-04-25 NOTE — ED Provider Notes (Signed)
[facility] EMERGENCY DEPARTMENT Provider Note   CSN: [id number] Arrival date & time: 04/25/20  1517     History Chief Complaint  Patient presents with  . Fall    Phillip West is a 86 y.o. male with a past medical history significant for chronic A. fib not on any anticoagulants, COPD, diabetes, hypertension, and history of abdominal and thoracic aortic aneurysm who presents to the ED after a mechanical fall in his yard.  Patient states he slipped causing him to fall directly on his left hip.  Patient denies head injury and loss of consciousness.  Patient is currently on ASA 81 mg, but no other anticoagulants.  Patient admits to left hip pain worse with movement of left lower extremity.  No treatment prior to arrival.  No previous left hip injury.  History obtained from patient and past medical records. No interpreter used during encounter.      Past Medical History:  Diagnosis Date  . Abdominal aortic aneurysm (AAA) ([place])   . Atrial fibrillation ([place])   . Chalazion right lower eyelid   . COPD (chronic obstructive pulmonary disease) ([place])   . Diabetes mellitus ([place])   . Gross hematuria   . Hypertension   . Syncope and collapse   . Thoracic aortic aneurysm (TAA) [facility])     Patient Active Problem List   Diagnosis Date Noted  . Thoracic aortic aneurysm (TAA) ([place])   . Chalazion right lower eyelid   . Hypertension   . Gross hematuria   . Diabetes mellitus ([place])   . Atrial fibrillation ([place])   . Essential hypertension 07/14/2018  . Hyperlipidemia 07/14/2018  . Atrial fibrillation, chronic ([place]) 07/14/2018  . Abdominal aortic aneurysm (AAA) ([place]) 07/14/2018  . Tobacco abuse 07/14/2018  . COPD (chronic obstructive pulmonary disease) ([place]) 07/14/2018    Past Surgical History:  Procedure Laterality Date  . HERNIA REPAIR    . KNEE ARTHROSCOPY         Family History  Problem Relation Age of Onset  . Heart disease Father   . Stroke Father      Social History   Tobacco Use  . Smoking status: Current Some Day Smoker    Packs/day: 1.00    Types: Cigarettes    Start date: 32  . Smokeless tobacco: Never Used  Vaping Use  . Vaping Use: Never used  Substance Use Topics  . Alcohol use: Not Currently  . Drug use: Never    Home Medications Prior to Admission medications   Medication Sig Start Date End Date Taking? Authorizing Provider  aspirin EC 81 MG tablet Take 81 mg by mouth daily.    [provider]  Cholecalciferol (VITAMIN D) 50 MCG (2000 UT) tablet Take 1 tablet by mouth daily. 04/30/18   [provider]  lisinopril (PRINIVIL,ZESTRIL) 5 MG tablet Take 2.5 mg by mouth daily.  06/06/18   [provider]  LUBRICATING PLUS EYE DROPS 0.5 % SOLN Place 1 drop into both eyes daily. 06/01/18   [provider]  metoprolol tartrate (LOPRESSOR) 25 MG tablet Take 12.5 mg by mouth 2 (two) times daily.    [provider]  pravastatin (PRAVACHOL) 40 MG tablet Take 1 tablet by mouth daily. 06/06/18   [provider]  SYMBICORT 160-4.5 MCG/ACT inhaler Inhale 2 puffs into the lungs 2 (two) times daily. 04/18/18   [provider]  tamsulosin (FLOMAX) 0.4 MG CAPS capsule Take 0.4 mg by mouth daily. 04/24/18   [provider]    Allergies    Quinolones  Review of Systems   Review of Systems  Constitutional: Negative for chills and fever.  Musculoskeletal: Positive for arthralgias and gait problem. Negative for back pain.  Neurological: Negative for headaches.  All other systems reviewed and are negative.   Physical Exam Updated Vital Signs BP 129/85   Pulse 77   Temp 97.7 F (36.5 C) (Oral)   Resp 14   Ht 5\' 8"  (1.727 m)   Wt 68 kg   SpO2 97%   BMI 22.81 kg/m   Physical Exam Vitals and nursing note reviewed.  Constitutional:      General: He is not in acute distress.    Appearance: He is not toxic-appearing.  HENT:     Head: Normocephalic.  Eyes:      Pupils: Pupils are equal, round, and reactive to light.  Neck:     Comments: No cervical midline tenderness. Cardiovascular:     Rate and Rhythm: Normal rate and regular rhythm.     Pulses: Normal pulses.     Heart sounds: Normal heart sounds. No murmur heard. No friction rub. No gallop.   Pulmonary:     Effort: Pulmonary effort is normal.     Breath sounds: Normal breath sounds.  Abdominal:     General: Abdomen is flat. There is no distension.     Palpations: Abdomen is soft.     Tenderness: There is no abdominal tenderness. There is no guarding or rebound.  Musculoskeletal:     Cervical back: Neck supple.     Comments: No thoracic or lumbar midline tenderness. Tenderness throughout left hip. LLE shortened and outwardly rotated. Distal pulses and sensation intact  Skin:    General: Skin is warm and dry.  Neurological:     General: No focal deficit present.     Mental Status: He is alert.  Psychiatric:        Mood and Affect: Mood normal.        Behavior: Behavior normal.     ED Results / Procedures / Treatments   Labs (all labs ordered are listed, but only abnormal results are displayed) Labs Reviewed  BASIC METABOLIC PANEL - Abnormal; Notable for the following components:      Result Value   Glucose, Bld 112 (*)    All other components within normal limits  CBC WITH DIFFERENTIAL/PLATELET - Abnormal; Notable for the following components:   RBC 4.13 (*)    MCV 101.7 (*)    Platelets 123 (*)    All other components within normal limits  RESP PANEL BY RT-PCR (FLU A&B, COVID) ARPGX2  PROTIME-INR  TYPE AND SCREEN  ABO/RH    EKG EKG Interpretation  Date/Time:  Friday April 25 2020 15:19:42 EST Ventricular Rate:  66 PR Interval:    QRS Duration: 84 QT Interval:  380 QTC Calculation: 399 R Axis:   -73 Text Interpretation: Atrial fibrillation Left anterior fascicular block Abnormal R-wave progression, early transition Abnormal ECG Confirmed by Carmin Muskrat [phone]) on 04/25/2020 3:21:27 PM   Radiology DG Chest 1 View  Result Date: 04/25/2020 CLINICAL DATA:  86 year old male with acute left hip fracture status post fall. EXAM: CHEST  1 VIEW COMPARISON:  CT a chest, Abdomen, and Pelvis 03/27/2020 and earlier. FINDINGS: AP supine view at 1544 hours. Mild cardiomegaly and moderate tortuosity of the thoracic aorta. Other mediastinal contours are within normal limits. Visualized tracheal air column is within normal limits. Allowing for portable  technique the lungs are clear. Multilevel chronic left lateral rib fractures beginning at the 5th rib level appear stable. IMPRESSION: No acute cardiopulmonary abnormality. Electronically Signed   By: Genevie Ann M.D.   On: 04/25/2020 16:19   DG Hip Unilat With Pelvis 2-3 Views Left  Result Date: 04/25/2020 CLINICAL DATA:  86 year old male status post fall with left hip pain. EXAM: DG HIP (WITH OR WITHOUT PELVIS) 2-3V LEFT COMPARISON:  [facility] 04/24/2018 CT Abdomen and Pelvis FINDINGS: comminuted left proximal femur distal neck fracture which likely extends into the intertrochanteric segment. Moderate impaction. Little to no angulation. Proximal left femoral shaft remains intact. No superimposed acute fracture of the pelvis identified. Proximal right femur appears grossly intact. Negative lower abdominal and pelvic visceral contours. Calcified femoral artery atherosclerosis. IMPRESSION: Acute fracture of the proximal left femur distal neck, likely with comminution into the intertrochanteric segment. Moderate impaction. Electronically Signed   By: Genevie Ann M.D.   On: 04/25/2020 16:18    Procedures Procedures (including critical care time)  Medications Ordered in ED Medications - No data to display  ED Course  I have reviewed the triage vital signs and the nursing notes.  Pertinent labs & imaging results that were available during my care of the patient were reviewed by me and considered in  my medical decision making (see chart for details).    MDM Rules/Calculators/A&P                         86 year old male presents to the ED after mechanical fall onto his left hip.  No head injury or loss of consciousness.  Patient is currently on ASA 81 mg, but no other anticoagulants.  On arrival, vitals all within normal limits.  Patient in no acute distress and nontoxic-appearing.  Physical exam significant for shortened left lower extremity with outward rotation.  No cervical, thoracic, or lumbar midline tenderness.  Left lower extremity pulses and sensation intact. Will obtain x-ray to rule out fracture. High suspicion for hip fracture. Will obtain routine labs.  Discussed case with Dr. Vanita Panda who evaluated patient at bedside and agrees with assessment and plan.  X-ray personally reviewed which demonstrates: IMPRESSION:  Acute fracture of the proximal left femur distal neck, likely with  comminution into the intertrochanteric segment. Moderate impaction.   Chest x-ray personally reviewed which demonstrates no signs pneumonia, pneumothorax, or widened mediastinum.  EKG personally reviewed which demonstrates A. fib with no signs of acute ischemia.  Discussed case with Dr. Lucia Gaskins with orthopedics who agrees to see patient. CT ordered per request. Discussed case with Dr. Charleen Kirks who agrees to admit patient for further treatment.   Final Clinical Impression(s) / ED Diagnoses Final diagnoses:  Fall, initial encounter  Closed left hip fracture, initial encounter [facility])    Rx / [place] [place] Orders ED Discharge Orders    None       Karie Kirks 04/25/20 1759    Carmin Muskrat, MD 04/29/20 2257

## 2020-04-25 NOTE — Consult Note (Signed)
Brief orthopedic consult note:  Phillip West is an 86 year old male status post fall at home onto the left hip.  X-rays in the emergency department revealed a intertrochanteric versus femoral neck fracture.  Orthopedics was consulted.  We will order a CT scan of the hip fracture to better understand the fracture pattern for surgical planning purposes.  Once the CT scan is obtained we will discuss the findings with the patient and come up with a plan.  For now okay to admit patient to the hospitalist service.  No plan for surgery tonight.  Possible surgery tomorrow.  Please have patient NPO past midnight.

## 2020-04-26 ENCOUNTER — Encounter (HOSPITAL_COMMUNITY)
Admission: EM | Disposition: A | Discharge status: Rehabilitation Facility | Payer: Self-pay | Source: Home / Self Care | Attending: Internal Medicine

## 2020-04-26 ENCOUNTER — Inpatient Hospital Stay (HOSPITAL_COMMUNITY): Payer: No Typology Code available for payment source

## 2020-04-26 ENCOUNTER — Inpatient Hospital Stay (HOSPITAL_COMMUNITY): Payer: No Typology Code available for payment source | Admitting: Certified Registered Nurse Anesthetist

## 2020-04-26 ENCOUNTER — Encounter (HOSPITAL_COMMUNITY): Payer: Self-pay | Admitting: Internal Medicine

## 2020-04-26 DIAGNOSIS — I4891 Unspecified atrial fibrillation: Secondary | ICD-10-CM

## 2020-04-26 HISTORY — PX: INTRAMEDULLARY (IM) NAIL INTERTROCHANTERIC: SHX5875

## 2020-04-26 LAB — CBC
HCT: 41 % (ref 39.0–52.0)
HCT: 38.7 % — ABNORMAL LOW (ref 39.0–52.0)
Hemoglobin: 13.3 g/dL (ref 13.0–17.0)
Hemoglobin: 12.5 g/dL — ABNORMAL LOW (ref 13.0–17.0)
MCH: 32.3 pg (ref 26.0–34.0)
MCH: 32.1 pg (ref 26.0–34.0)
MCHC: 32.4 g/dL (ref 30.0–36.0)
MCHC: 32.3 g/dL (ref 30.0–36.0)
MCV: 99.5 fL (ref 80.0–100.0)
MCV: 99.2 fL (ref 80.0–100.0)
Platelets: 118 10*3/uL — ABNORMAL LOW (ref 150–400)
Platelets: 107 10*3/uL — ABNORMAL LOW (ref 150–400)
RBC: 4.12 MIL/uL — ABNORMAL LOW (ref 4.22–5.81)
RBC: 3.9 MIL/uL — ABNORMAL LOW (ref 4.22–5.81)
RDW: 13.7 % (ref 11.5–15.5)
RDW: 13.7 % (ref 11.5–15.5)
WBC: 9.3 10*3/uL (ref 4.0–10.5)
WBC: 11.3 10*3/uL — ABNORMAL HIGH (ref 4.0–10.5)
nRBC: 0 % (ref 0.0–0.2)
nRBC: 0 % (ref 0.0–0.2)

## 2020-04-26 LAB — GLUCOSE, CAPILLARY
Glucose-Capillary: 133 mg/dL — ABNORMAL HIGH (ref 70–99)
Glucose-Capillary: 138 mg/dL — ABNORMAL HIGH (ref 70–99)
Glucose-Capillary: 215 mg/dL — ABNORMAL HIGH (ref 70–99)

## 2020-04-26 LAB — BASIC METABOLIC PANEL
Anion gap: 9 (ref 5–15)
BUN: 9 mg/dL (ref 8–23)
CO2: 22 mmol/L (ref 22–32)
Calcium: 9.3 mg/dL (ref 8.9–10.3)
Chloride: 104 mmol/L (ref 98–111)
Creatinine, Ser: 0.81 mg/dL (ref 0.61–1.24)
GFR, Estimated: >60 mL/min (ref >60)
Glucose, Bld: 120 mg/dL — ABNORMAL HIGH (ref 70–99)
Potassium: 4 mmol/L (ref 3.5–5.1)
Sodium: 135 mmol/L (ref 135–145)

## 2020-04-26 LAB — CBG MONITORING, ED: Glucose-Capillary: 110 mg/dL — ABNORMAL HIGH (ref 70–99)

## 2020-04-26 LAB — CREATININE, SERUM
Creatinine, Ser: 0.86 mg/dL (ref 0.61–1.24)
GFR, Estimated: >60 mL/min (ref >60)

## 2020-04-26 SURGERY — FIXATION, FRACTURE, INTERTROCHANTERIC, WITH INTRAMEDULLARY ROD
Anesthesia: General | Site: Hip | Laterality: Left

## 2020-04-26 MED ORDER — HYDROMORPHONE HCL 1 MG/ML IJ SOLN
0.2500 mg | INTRAMUSCULAR | Status: DC | PRN
Start: 2020-04-26 — End: 2020-04-26

## 2020-04-26 MED ORDER — LIDOCAINE 2% (20 MG/ML) 5 ML SYRINGE
INTRAMUSCULAR | Status: DC | PRN
  Administered 2020-04-26: 80 mg via INTRAVENOUS

## 2020-04-26 MED ORDER — ONDANSETRON HCL 4 MG/2ML IJ SOLN: 4.0000 mg | Freq: Four times a day (QID) | INTRAMUSCULAR | Status: DC | PRN

## 2020-04-26 MED ORDER — PHENYLEPHRINE 40 MCG/ML (10ML) SYRINGE FOR IV PUSH (FOR BLOOD PRESSURE SUPPORT)
PREFILLED_SYRINGE | INTRAVENOUS | Status: DC | PRN
  Administered 2020-04-26: 80 ug via INTRAVENOUS
  Administered 2020-04-26: 120 ug via INTRAVENOUS
  Administered 2020-04-26: 80 ug via INTRAVENOUS
  Administered 2020-04-26: 120 ug via INTRAVENOUS

## 2020-04-26 MED ORDER — ACETAMINOPHEN 10 MG/ML IV SOLN: 1000.0000 mg | Freq: Once | INTRAVENOUS | Status: DC | PRN

## 2020-04-26 MED ORDER — CEFAZOLIN SODIUM-DEXTROSE 2-4 GM/100ML-% IV SOLN
INTRAVENOUS | Status: AC
  Filled 2020-04-26: qty 100

## 2020-04-26 MED ORDER — SUGAMMADEX SODIUM 200 MG/2ML IV SOLN
INTRAVENOUS | Status: DC | PRN
  Administered 2020-04-26: 200 mg via INTRAVENOUS

## 2020-04-26 MED ORDER — EPHEDRINE SULFATE-NACL 50-0.9 MG/10ML-% IV SOSY
PREFILLED_SYRINGE | INTRAVENOUS | Status: DC | PRN
  Administered 2020-04-26: 10 mg via INTRAVENOUS

## 2020-04-26 MED ORDER — ENOXAPARIN SODIUM 40 MG/0.4ML ~~LOC~~ SOLN
40.0000 mg | SUBCUTANEOUS | Status: DC
  Administered 2020-04-27 – 2020-05-01 (×5): 40 mg via SUBCUTANEOUS
  Filled 2020-04-26 (×5): qty 0.4

## 2020-04-26 MED ORDER — FENTANYL CITRATE (PF) 250 MCG/5ML IJ SOLN
INTRAMUSCULAR | Status: DC | PRN
  Administered 2020-04-26: 100 ug via INTRAVENOUS
  Administered 2020-04-26: 50 ug via INTRAVENOUS

## 2020-04-26 MED ORDER — ROCURONIUM BROMIDE 10 MG/ML (PF) SYRINGE
PREFILLED_SYRINGE | INTRAVENOUS | Status: DC | PRN
  Administered 2020-04-26: 50 mg via INTRAVENOUS

## 2020-04-26 MED ORDER — ONDANSETRON HCL 4 MG PO TABS: 4.0000 mg | ORAL_TABLET | Freq: Four times a day (QID) | ORAL | Status: DC | PRN

## 2020-04-26 MED ORDER — PHENOL 1.4 % MT LIQD: 1.0000 | OROMUCOSAL | Status: DC | PRN

## 2020-04-26 MED ORDER — PROPOFOL 10 MG/ML IV BOLUS
INTRAVENOUS | Status: AC
  Filled 2020-04-26: qty 20

## 2020-04-26 MED ORDER — PROMETHAZINE HCL 25 MG/ML IJ SOLN
6.2500 mg | INTRAMUSCULAR | Status: DC | PRN
Start: 2020-04-26 — End: 2020-04-26

## 2020-04-26 MED ORDER — DEXAMETHASONE SODIUM PHOSPHATE 10 MG/ML IJ SOLN
INTRAMUSCULAR | Status: DC | PRN
  Administered 2020-04-26: 5 mg via INTRAVENOUS

## 2020-04-26 MED ORDER — CHLORHEXIDINE GLUCONATE 0.12 % MT SOLN: 15.0000 mL | Freq: Once | OROMUCOSAL | Status: AC

## 2020-04-26 MED ORDER — CEFAZOLIN SODIUM-DEXTROSE 2-4 GM/100ML-% IV SOLN
2.0000 g | INTRAVENOUS | Status: AC
  Administered 2020-04-26: 12:00:00 2 g via INTRAVENOUS

## 2020-04-26 MED ORDER — ALBUMIN HUMAN 5 % IV SOLN: INTRAVENOUS | Status: DC | PRN

## 2020-04-26 MED ORDER — FENTANYL CITRATE (PF) 250 MCG/5ML IJ SOLN
INTRAMUSCULAR | Status: AC
  Filled 2020-04-26: qty 5

## 2020-04-26 MED ORDER — METOCLOPRAMIDE HCL 5 MG/ML IJ SOLN: 5.0000 mg | Freq: Three times a day (TID) | INTRAMUSCULAR | Status: DC | PRN

## 2020-04-26 MED ORDER — LACTATED RINGERS IV SOLN: INTRAVENOUS | Status: DC

## 2020-04-26 MED ORDER — METOCLOPRAMIDE HCL 5 MG PO TABS
5.0000 mg | ORAL_TABLET | Freq: Three times a day (TID) | ORAL | Status: DC | PRN
Start: 2020-04-26 — End: 2020-04-28

## 2020-04-26 MED ORDER — CHLORHEXIDINE GLUCONATE 0.12 % MT SOLN
OROMUCOSAL | Status: AC
  Administered 2020-04-26: 10:00:00 15 mL via OROMUCOSAL
  Filled 2020-04-26: qty 15

## 2020-04-26 MED ORDER — PHENYLEPHRINE HCL-NACL 10-0.9 MG/250ML-% IV SOLN
INTRAVENOUS | Status: DC | PRN
  Administered 2020-04-26: 45 ug/min via INTRAVENOUS

## 2020-04-26 MED ORDER — ONDANSETRON HCL 4 MG/2ML IJ SOLN
INTRAMUSCULAR | Status: DC | PRN
  Administered 2020-04-26: 4 mg via INTRAVENOUS

## 2020-04-26 MED ORDER — MENTHOL 3 MG MT LOZG: 1.0000 | LOZENGE | OROMUCOSAL | Status: DC | PRN

## 2020-04-26 MED ORDER — 0.9 % SODIUM CHLORIDE (POUR BTL) OPTIME
TOPICAL | Status: DC | PRN
  Administered 2020-04-26: 12:00:00 1000 mL

## 2020-04-26 MED ORDER — PROPOFOL 10 MG/ML IV BOLUS
INTRAVENOUS | Status: DC | PRN
  Administered 2020-04-26: 120 mg via INTRAVENOUS

## 2020-04-26 MED ORDER — DOCUSATE SODIUM 100 MG PO CAPS
100.0000 mg | ORAL_CAPSULE | Freq: Two times a day (BID) | ORAL | Status: DC
  Administered 2020-04-26 – 2020-05-01 (×8): 100 mg via ORAL
  Filled 2020-04-26 (×8): qty 1

## 2020-04-26 MED ORDER — CEFAZOLIN SODIUM-DEXTROSE 2-4 GM/100ML-% IV SOLN
2.0000 g | Freq: Four times a day (QID) | INTRAVENOUS | Status: AC
  Administered 2020-04-26 (×2): 2 g via INTRAVENOUS
  Filled 2020-04-26 (×2): qty 100

## 2020-04-26 SURGICAL SUPPLY — 41 items
APL PRP STRL LF DISP 70% ISPRP (MISCELLANEOUS) ×1
BIT DRILL CANN LG 4.3MM (BIT) IMPLANT
BNDG COHESIVE 6X5 TAN STRL LF (GAUZE/BANDAGES/DRESSINGS) ×6 IMPLANT
BNDG ELASTIC 4X5.8 VLCR STR LF (GAUZE/BANDAGES/DRESSINGS) ×1 IMPLANT
BNDG ELASTIC 6X5.8 VLCR STR LF (GAUZE/BANDAGES/DRESSINGS) ×1 IMPLANT
CHLORAPREP W/TINT 26 (MISCELLANEOUS) ×3 IMPLANT
COVER SURGICAL LIGHT HANDLE (MISCELLANEOUS) ×3 IMPLANT
DRAPE C-ARM 42X72 X-RAY (DRAPES) ×2 IMPLANT
DRAPE C-ARMOR (DRAPES) ×3 IMPLANT
DRAPE INCISE IOBAN 66X45 STRL (DRAPES) ×3 IMPLANT
DRILL BIT CANN LG 4.3MM (BIT) ×3
DRSG MEPILEX BORDER 4X4 (GAUZE/BANDAGES/DRESSINGS) ×9 IMPLANT
DRSG MEPILEX BORDER 4X8 (GAUZE/BANDAGES/DRESSINGS) ×1 IMPLANT
ELECT REM PT RETURN 9FT ADLT (ELECTROSURGICAL) ×3
ELECTRODE REM PT RTRN 9FT ADLT (ELECTROSURGICAL) ×1 IMPLANT
GLOVE BIOGEL M STRL SZ7.5 (GLOVE) ×3 IMPLANT
GLOVE BIOGEL PI IND STRL 8 (GLOVE) ×1 IMPLANT
GLOVE BIOGEL PI INDICATOR 8 (GLOVE) ×2
GOWN STRL REUS W/ TWL LRG LVL3 (GOWN DISPOSABLE) ×1 IMPLANT
GOWN STRL REUS W/ TWL XL LVL3 (GOWN DISPOSABLE) ×1 IMPLANT
GOWN STRL REUS W/TWL LRG LVL3 (GOWN DISPOSABLE) ×3
GOWN STRL REUS W/TWL XL LVL3 (GOWN DISPOSABLE) ×3
GUIDEPIN 3.2X17.5 THRD DISP (PIN) ×4 IMPLANT
HIP FRAC NAIL LAG SCR 10.5X100 (Orthopedic Implant) ×3 IMPLANT
KIT BASIN OR (CUSTOM PROCEDURE TRAY) ×3 IMPLANT
KIT TURNOVER KIT B (KITS) ×3 IMPLANT
NAIL HIP FRACT 130D 11X180 (Screw) ×2 IMPLANT
NS IRRIG 1000ML POUR BTL (IV SOLUTION) ×3 IMPLANT
PACK GENERAL/GYN (CUSTOM PROCEDURE TRAY) ×3 IMPLANT
PAD ARMBOARD 7.5X6 YLW CONV (MISCELLANEOUS) ×6 IMPLANT
SCREW ANTI ROTATION 80MM (Screw) ×2 IMPLANT
SCREW BONE CORTICAL 5.0X38 (Screw) ×2 IMPLANT
SCREW CANN THRD AFF 10.5X100 (Orthopedic Implant) IMPLANT
SCREW DRILL BIT ANIT ROTATION (BIT) ×2 IMPLANT
STAPLER VISISTAT 35W (STAPLE) ×3 IMPLANT
SUT MON AB 2-0 SH 27 (SUTURE) ×3
SUT MON AB 2-0 SH27 (SUTURE) IMPLANT
TOWEL GREEN STERILE (TOWEL DISPOSABLE) ×6 IMPLANT
TOWEL GREEN STERILE FF (TOWEL DISPOSABLE) ×3 IMPLANT
UNDERPAD 30X36 HEAVY ABSORB (UNDERPADS AND DIAPERS) ×1 IMPLANT
WATER STERILE IRR 1000ML POUR (IV SOLUTION) ×3 IMPLANT

## 2020-04-26 NOTE — Progress Notes (Signed)
° °  Subjective: HD#1  No acute overnight events. [name] states he has another sore spot on his left chest that started this morning. He denies other chest pain or abdominal pain.   Gross hematuria noted on bedside urinal.   Objective:  Vital signs in last 24 hours: Vitals:   04/26/20 0733 04/26/20 0815 04/26/20 0900 04/26/20 0931  BP:  134/71 122/74   Pulse:  67 80 85  Resp:  18 19   Temp: 98.6 F (37 C)     TempSrc: Oral     SpO2:  96% 96%   Weight:      Height:       CBC Latest Ref Rng & Units 04/26/2020 04/25/2020  WBC 4.0 - 10.5 K/uL 9.3 10.2  Hemoglobin 13.0 - 17.0 g/dL 13.3 13.1  Hematocrit 39.0 - 52.0 % 41.0 42.0  Platelets 150 - 400 K/uL 118(L) 123(L)   BMP Latest Ref Rng & Units 04/26/2020 04/25/2020 06/15/2019  Glucose 70 - 99 mg/dL 120(H) 112(H) -  BUN 8 - 23 mg/dL 9 13 -  Creatinine 0.61 - 1.24 mg/dL 0.81 0.88 0.87  Sodium 135 - 145 mmol/L 135 138 -  Potassium 3.5 - 5.1 mmol/L 4.0 4.3 -  Chloride 98 - 111 mmol/L 104 105 -  CO2 22 - 32 mmol/L 22 24 -  Calcium 8.9 - 10.3 mg/dL 9.3 9.4 -   Physical exam:  General: Well developed, well nourished elderly male, lying comfortably in bed, NAD CV: Normal rate, Rythym irregularly irregular, No m/r/g Abdominal: No rigidity, no guarding, no tenderness, BS+ Musculoskeletal: Left leg shortened and externally rotated Neuro: AAO*3 Psych: Normal mood and affect.  Assessment/Plan: [name] is a 86 y/o male with a PMHx of thoracic and abdominal aortic aneurysms, COPD, A. Fib not on AC, T2DM who presents to the ED s/p mechanical fall currently admitted for left femur fracture.  Active Problems:   Femur fracture, left ([place])    # Left Femur Fracture:  Secondary to mechanical fall although patient states he could possibly have been dizzy.   - Orthopedic surgery consulted; surgery today - Dilaudid 0.5 q4h PRN for severe pain - Will need anticoagulation post-op  # A. Fib, rate controlled  Likely  permanent A. Fib, although previous records unable to be obtained. He is rate controlled on Metoprolol. Per chart review, he is not on [facility] due to hematuria.   - Continue home Metoprolol 12.5 mg BID  # T2DM  - SSI (sensitive)   # COPD  - Dulera BID  - Albuterol PRN   # Thoracic and Aortic Aneurysm  Ascending aortic aneurysm 5.3 cm with infrarenal AAA estimated 4.4 cm.  Follows with Dr. Servando Snare with [facility]. Recent follow up in November 2021, decision was made to hold off on surgery due to pulmonary disease. No acute chest pain or abdominal pain at this time. No additional intervention at this time.   # Gross hematuria # Hx of Renal Cysts Chronic problem. Followed with [facility]. Notes he has cystoscopy and CT bladder. Unable to see records.   Prior to Admission Living Arrangement: Home Anticipated Discharge Location: Home Barriers to Discharge: Ongoing medical & surgical management Dispo: Anticipated discharge in approximately 3-4 day(s).   Honor Junes, MD 04/26/2020, 11:38 AM Pager: [phone] After 5pm on weekdays and 1pm on weekends: On Call pager [phone]

## 2020-04-26 NOTE — Anesthesia Postprocedure Evaluation (Signed)
Anesthesia Post Note  Patient: Phillip West  Procedure(s) Performed: INTRAMEDULLARY (IM) NAIL INTERTROCHANTRIC (Left Hip)     Patient location during evaluation: PACU Anesthesia Type: General Level of consciousness: awake and alert Pain management: pain level controlled Vital Signs Assessment: post-procedure vital signs reviewed and stable Respiratory status: spontaneous breathing, nonlabored ventilation, respiratory function stable and patient connected to nasal cannula oxygen Cardiovascular status: blood pressure returned to baseline and stable Postop Assessment: no apparent nausea or vomiting Anesthetic complications: no   No complications documented.  Last Vitals:  Vitals:   04/26/20 1435 04/26/20 1521  BP: 101/66 100/69  Pulse: 81 98  Resp: 20 17  Temp: 36.8 C 36.6 C  SpO2: 92% 90%    Last Pain:  Vitals:   04/26/20 1521  TempSrc: Oral  PainSc:                  [name] Albert Hersch

## 2020-04-26 NOTE — Progress Notes (Signed)
Received pt from PACU s/p  IM nailing of left hip fx, surgical site CDI, Pt alert/oriented in no apparent distress. Orientated/situated to room equipments/room phone.  Welcome guide/menu provided with instructions, and pt verbalized understanding of instructions. No complaints voiced Call bell/room phone within reach. Bed in lowest position with 3 side rails up  And all wheels locked.

## 2020-04-26 NOTE — ED Notes (Signed)
Placed pt on hospital bed for comfort. 

## 2020-04-26 NOTE — Anesthesia Procedure Notes (Signed)
Procedure Name: Intubation Date/Time: 04/26/2020 11:45 AM Performed by: Darletta Moll, CRNA Pre-anesthesia Checklist: Patient identified, Emergency Drugs available, Suction available and Patient being monitored Patient Re-evaluated:Patient Re-evaluated prior to induction Oxygen Delivery Method: Circle system utilized Preoxygenation: Pre-oxygenation with 100% oxygen Induction Type: IV induction Ventilation: Mask ventilation without difficulty and Oral airway inserted - appropriate to patient size Laryngoscope Size: Mac and 4 Grade View: Grade II Tube type: Oral Tube size: 7.5 mm Number of attempts: 1 Airway Equipment and Method: Stylet Placement Confirmation: ETT inserted through vocal cords under direct vision,  positive ETCO2 and breath sounds checked- equal and bilateral Secured at: 22 cm Tube secured with: Tape Dental Injury: Teeth and Oropharynx as per pre-operative assessment

## 2020-04-26 NOTE — Op Note (Signed)
Phillip West male 86 y.o. 04/26/2020  PreOperative Diagnosis: Left intertrochanteric hip fracture  PostOperative Diagnosis: Same  PROCEDURE: Cephalomedullary nail of left intertrochanteric hip fracture  SURGEON: Melony Overly, MD  ASSISTANT: None  ANESTHESIA: General  FINDINGS: See below  IMPLANTS: [employer] 11 x 180 nail  INDICATIONS:86 y.o. male fell while raking the yard onto his left hip. X-rays revealed a left displaced intertrochanteric hip fracture. Given his ambulatory status and fracture pattern he is indicated for surgery.   Patient understood the risks, benefits and alternatives to surgery which include but are not limited to wound healing complications, infection, nonunion, malunion, need for further surgery as well as damage to surrounding structures. They also understood the potential for continued pain in that there were no guarantees of acceptable outcome After weighing these risks the patient opted to proceed with surgery.  PROCEDURE: Patient was identified in the preoperative holding area and the left hip was marked by myself.  The consent was signed by myself and the patient.  This identified the left lower extremity as the appropriate operative extremity and the surgeries to be performed was operative treatment of left hip fracture. They  were taken to the operating room where general endotracheal anesthesia was induced without difficulty and then was moved supine on a Hana table.  The operative leg was placed into the traction boot.  The non-operative leg was well-padded and affixed to the right leg holder with coban.  The arm was crossed over the chest and well padded. 2 g of Ancef was given.  Surgical timeout was performed.  Using fluoroscopy appropriate AP and lateral x-rays of the left hip were obtained.  Then using traction through the Hana table was able to reduce the fracture fragments in a closed fashion.  Acceptable reduction was confirmed on  AP and lateral x-rays.  Then the left hip was prepped and draped in usual sterile fashion.  A shower curtain drape was placed.  We began the surgery by placing the guidepin percutaneously at the appropriate starting point at the tip of the greater trochanter.  The appropriate starting point was confirmed on AP and lateral radiograph.  Then a 3 cm incision was made about the site of the percutaneous placement of the pin and the opening reamer with a soft tissue guide was placed into the wound down to the tip of the greater trochanter and the bone was entered with the opening reamer down to the level of lesser trochanter.  Then the openning reamer and guide pin were removed. The cephalomedullary nail was then placed within the femur without difficulty. This was placed without difficulty and appropriate positioning was confirmed on fluoroscopy.  Then using the cephalomedullary screw insertion guide a second incision was made distally down the thigh to allow for placement of the cephalo-medullary screw.  The guidepin for the blade was placed up the femoral neck in the appropriate position center center in the femoral head. A derotation wire was placed. Then the reamer was used to ream for the blade and the blade was placed without difficulty.  The length of the screw was a 100 mm.  Then the set screw was tightened. Then the derotational screw was placed. This was an 80 mm screw. Then using the guide the distal interlocking screw was placed without difficulty. Final films were taken.  The wounds were irrigated with normal saline and the incisions were closed in a layered fashion using 2-0 Monocryl and staples.  Mepilex dressings were used.  He was then transferred to the hospital bed and awakened from anesthesia without difficulty.  Counts were correct at the end the case.  There were no complications.  POST OPERATIVE INSTRUCTIONS: Weightbearing as tolerated to operative extremity Work with physical  therapy Lovenox for DVT prophylaxis while in house and 325 mg aspirin on discharge for 1 month   TOURNIQUET TIME: No tourniquet was used  BLOOD LOSS:  200 mL         DRAINS: none         SPECIMEN: none       COMPLICATIONS:  * No complications entered in OR log *         Disposition: PACU - hemodynamically stable.         Condition: stable

## 2020-04-26 NOTE — Anesthesia Preprocedure Evaluation (Addendum)
Anesthesia Evaluation  Patient identified by MRN, date of birth, ID band Patient awake    Reviewed: Patient's Chart, lab work & pertinent test results, reviewed documented beta blocker date and time   Airway Mallampati: II  TM Distance: >3 FB Neck ROM: Full    Dental  (+) Upper Dentures, Lower Dentures   Pulmonary COPD,  COPD inhaler, Current Smoker,    Pulmonary exam normal        Cardiovascular hypertension, Pt. on medications and Pt. on home beta blockers + dysrhythmias Atrial Fibrillation  Rhythm:Irregular Rate:Normal     Neuro/Psych negative neurological ROS  negative psych ROS   GI/Hepatic negative GI ROS, Neg liver ROS,   Endo/Other  diabetes, Type 2, Insulin Dependent  Renal/GU negative Renal ROS  negative genitourinary   Musculoskeletal Left intertrochanteric fracture s/p mechanical fall   Abdominal (+)  Abdomen: soft. Bowel sounds: normal.  Peds  Hematology negative hematology ROS (+)   Anesthesia Other Findings   Reproductive/Obstetrics                            Anesthesia Physical Anesthesia Plan  ASA: III  Anesthesia Plan: General   Post-op Pain Management:    Induction: Intravenous  PONV Risk Score and Plan: 1 and Ondansetron, Dexamethasone and Treatment may vary due to age or medical condition  Airway Management Planned: Mask and Oral ETT  Additional Equipment: None  Intra-op Plan:   Post-operative Plan: Extubation in OR  Informed Consent: I have reviewed the patients History and Physical, chart, labs and discussed the procedure including the risks, benefits and alternatives for the proposed anesthesia with the patient or authorized representative who has indicated his/her understanding and acceptance.     Dental advisory given  Plan Discussed with: CRNA  Anesthesia Plan Comments: (Lab Results      Component                Value               Date                       WBC                      9.3                 04/26/2020                HGB                      13.3                04/26/2020                HCT                      41.0                04/26/2020                MCV                      99.5                04/26/2020                PLT  118 (L)             04/26/2020           Lab Results      Component                Value               Date                      NA                       135                 04/26/2020                K                        4.0                 04/26/2020                CO2                      22                  04/26/2020                GLUCOSE                  120 (H)             04/26/2020                BUN                      9                   04/26/2020                CREATININE               0.81                04/26/2020                CALCIUM                  9.3                 04/26/2020                GFRNONAA                 >60                 04/26/2020          )        Anesthesia Quick Evaluation

## 2020-04-26 NOTE — Consult Note (Signed)
Reason for Consult: Left intertrochanteric hip fracture   Referring Physician: [facility]  Phillip West is an 86 y.o. male.  HPI: Patient was outside raking leaves and had a fall.  He fell onto his left hip.  He was unable to ambulate afterwards.  EMS brought him to the emergency department where x-rays revealed a left intertrochanteric hip fracture.  Orthopedics was consulted.  Patient has chronic A. fib and takes 81 mg aspirin daily.  He is ambulatory at baseline.  Patient complains of pain in the left hip.  Denies upper extremity or right lower extremity pain.  He ambulates without assistive device at baseline.  Denies numbness or tingling.  Past Medical History:  Diagnosis Date  . Abdominal aortic aneurysm (AAA) ([place])   . Atrial fibrillation ([place])   . [place]azion right lower eyelid   . COPD (chronic obstructive pulmonary disease) ([place])   . Diabetes mellitus ([place])   . Gross hematuria   . Hypertension   . Syncope and collapse   . Thoracic aortic aneurysm (TAA) ([place])     Past Surgical History:  Procedure Laterality Date  . HERNIA REPAIR    . KNEE ARTHROSCOPY      Family History  Problem Relation Age of Onset  . Heart disease Father   . Stroke Father     Social History:  reports that he has been smoking cigarettes. He started smoking about 48 years ago. He has been smoking about 1.00 pack per day. He has never used smokeless tobacco. He reports previous alcohol use. He reports that he does not use drugs.  Allergies:  Allergies  Allergen Reactions  . Quinolones     Patient was warned about not using Cipro and similar antibiotics. Recent studies have raised concern that fluoroquinolone antibiotics could be associated with an increased risk of aortic aneurysm Fluoroquinolones have non-antimicrobial properties that might jeopardise the integrity of the extracellular matrix of the vascular wall In a  propensity score matched cohort study in [place], there was  a 66% increased rate of aortic aneurysm or dissection associated with oral fluoroquinolone use, compared wit    Medications: I have reviewed the patient's current medications.  Results for orders placed or performed during the hospital encounter of 04/25/20 (from the past 48 hour(s))  Basic metabolic panel     Status: Abnormal   Collection Time: 04/25/20  3:26 PM  Result Value Ref Range   Sodium 138 135 - 145 mmol/L   Potassium 4.3 3.5 - 5.1 mmol/L   Chloride 105 98 - 111 mmol/L   CO2 24 22 - 32 mmol/L   Glucose, Bld 112 (H) 70 - 99 mg/dL    Comment: Glucose reference range applies only to samples taken after fasting for at least 8 hours.   BUN 13 8 - 23 mg/dL   Creatinine, Ser 0.88 0.61 - 1.24 mg/dL   Calcium 9.4 8.9 - 10.3 mg/dL   GFR, Estimated >60 >60 mL/min    Comment: (NOTE) Calculated using the CKD-EPI Creatinine Equation (2021)    Anion gap 9 5 - 15    Comment: Performed at [facility] [address], [place], [place] [address]  CBC WITH DIFFERENTIAL     Status: Abnormal   Collection Time: 04/25/20  3:26 PM  Result Value Ref Range   WBC 10.2 4.0 - 10.5 K/uL   RBC 4.13 (L) 4.22 - 5.81 MIL/uL   Hemoglobin 13.1 13.0 - 17.0 g/dL   HCT 42.0 39.0 -  52.0 %   MCV 101.7 (H) 80.0 - 100.0 fL   MCH 31.7 26.0 - 34.0 pg   MCHC 31.2 30.0 - 36.0 g/dL   RDW 13.7 11.5 - 15.5 %   Platelets 123 (L) 150 - 400 K/uL   nRBC 0.0 0.0 - 0.2 %   Neutrophils Relative % 74 %   Neutro Abs 7.7 1.7 - 7.7 K/uL   Lymphocytes Relative 13 %   Lymphs Abs 1.3 0.7 - 4.0 K/uL   Monocytes Relative 9 %   Monocytes Absolute 0.9 0.1 - 1.0 K/uL   Eosinophils Relative 2 %   Eosinophils Absolute 0.2 0.0 - 0.5 K/uL   Basophils Relative 1 %   Basophils Absolute 0.1 0.0 - 0.1 K/uL   Immature Granulocytes 1 %   Abs Immature Granulocytes 0.07 0.00 - 0.07 K/uL    Comment: Performed at [facility] [address], [place], [place] [address]  Protime-INR     Status: None   Collection Time: 04/25/20   3:26 PM  Result Value Ref Range   Prothrombin Time 13.9 11.4 - 15.2 seconds   INR 1.1 0.8 - 1.2    Comment: (NOTE) INR goal varies based on device and disease states. Performed at [facility], [place] [address] [place],  [address]   Resp Panel by RT-PCR (Flu A&B, Covid) Nasopharyngeal Swab     Status: None   Collection Time: 04/25/20  3:26 PM   Specimen: Nasopharyngeal Swab; Nasopharyngeal(NP) swabs in vial transport medium  Result Value Ref Range   SARS Coronavirus 2 by RT PCR NEGATIVE NEGATIVE    Comment: (NOTE) SARS-CoV-2 target nucleic acids are NOT DETECTED.  The SARS-CoV-2 RNA is generally detectable in upper respiratory specimens during the acute phase of infection. The lowest concentration of SARS-CoV-2 viral copies this assay can detect is 138 copies/mL. A negative result does not preclude SARS-Cov-2 infection and should not be used as the sole basis for treatment or other patient management decisions. A negative result may occur with  improper specimen collection/handling, submission of specimen other than nasopharyngeal swab, presence of viral mutation(s) within the areas targeted by this assay, and inadequate number of viral copies(<138 copies/mL). A negative result must be combined with clinical observations, patient history, and epidemiological information. The expected result is Negative.  Fact Sheet for Patients:  EntrepreneurPulse.com.au  Fact Sheet for Healthcare Providers:  IncredibleEmployment.be  This test is no t yet approved or cleared by the [place] FDA and  has been authorized for detection and/or diagnosis of SARS-CoV-2 by FDA under an Emergency Use Authorization (EUA). This EUA will remain  in effect (meaning this test can be used) for the duration of the COVID-19 declaration under Section 564(b)(1) of the Act, 21 U.S.C.section 360bbb-3(b)(1), unless the authorization is terminated  or revoked  sooner.       Influenza A by PCR NEGATIVE NEGATIVE   Influenza B by PCR NEGATIVE NEGATIVE    Comment: (NOTE) The Xpert Xpress SARS-CoV-2/FLU/RSV plus assay is intended as an aid in the diagnosis of influenza from Nasopharyngeal swab specimens and should not be used as a sole basis for treatment. Nasal washings and aspirates are unacceptable for Xpert Xpress SARS-CoV-2/FLU/RSV testing.  Fact Sheet for Patients: EntrepreneurPulse.com.au  Fact Sheet for Healthcare Providers: IncredibleEmployment.be  This test is not yet approved or cleared by the [place] FDA and has been authorized for detection and/or diagnosis of SARS-CoV-2 by FDA under an Emergency Use Authorization (EUA). This EUA  will remain in effect (meaning this test can be used) for the duration of the COVID-19 declaration under Section 564(b)(1) of the Act, 21 U.S.C. section 360bbb-3(b)(1), unless the authorization is terminated or revoked.  Performed at [facility], [place] [address], [place], [place] [address]   Type and screen [place]     Status: None   Collection Time: 04/25/20  4:05 PM  Result Value Ref Range   ABO/RH(D) A POS    Antibody Screen NEG    Sample Expiration      04/28/2020,2359 Performed at [facility], [place] [address] [place], [place] [address]   ABO/Rh     Status: None   Collection Time: 04/25/20  6:28 PM  Result Value Ref Range   ABO/RH(D)      A POS Performed at [facility] [address], [place], [place] [address]   Hemoglobin A1c     Status: Abnormal   Collection Time: 04/25/20  6:28 PM  Result Value Ref Range   Hgb A1c MFr Bld 6.4 (H) 4.8 - 5.6 %    Comment: (NOTE) Pre diabetes:          5.7%-6.4%  Diabetes:              >6.4%  Glycemic control for   <7.0% adults with diabetes    Mean Plasma Glucose 136.98 mg/dL    Comment: Performed at [facility] [address], [place],   [address]  CBG monitoring, ED     Status: Abnormal   Collection Time: 04/25/20 10:04 PM  Result Value Ref Range   Glucose-Capillary 112 (H) 70 - 99 mg/dL    Comment: Glucose reference range applies only to samples taken after fasting for at least 8 hours.  Basic metabolic panel     Status: Abnormal   Collection Time: 04/26/20  3:59 AM  Result Value Ref Range   Sodium 135 135 - 145 mmol/L   Potassium 4.0 3.5 - 5.1 mmol/L   Chloride 104 98 - 111 mmol/L   CO2 22 22 - 32 mmol/L   Glucose, Bld 120 (H) 70 - 99 mg/dL    Comment: Glucose reference range applies only to samples taken after fasting for at least 8 hours.   BUN 9 8 - 23 mg/dL   Creatinine, Ser 0.81 0.61 - 1.24 mg/dL   Calcium 9.3 8.9 - 10.3 mg/dL   GFR, Estimated >60 >60 mL/min    Comment: (NOTE) Calculated using the CKD-EPI Creatinine Equation (2021)    Anion gap 9 5 - 15    Comment: Performed at [facility] [address], [place], [place] [address]  CBC     Status: Abnormal   Collection Time: 04/26/20  3:59 AM  Result Value Ref Range   WBC 9.3 4.0 - 10.5 K/uL   RBC 4.12 (L) 4.22 - 5.81 MIL/uL   Hemoglobin 13.3 13.0 - 17.0 g/dL   HCT 41.0 39.0 - 52.0 %   MCV 99.5 80.0 - 100.0 fL   MCH 32.3 26.0 - 34.0 pg   MCHC 32.4 30.0 - 36.0 g/dL   RDW 13.7 11.5 - 15.5 %   Platelets 118 (L) 150 - 400 K/uL    Comment: REPEATED TO VERIFY PLATELET COUNT CONFIRMED BY SMEAR SPECIMEN CHECKED FOR CLOTS Immature Platelet Fraction may be clinically indicated, consider ordering this additional test [id number]    nRBC 0.0 0.0 - 0.2 %  Comment: Performed at [facility], [place] [address] [place], [place] [address]  CBG monitoring, ED     Status: Abnormal   Collection Time: 04/26/20  9:01 AM  Result Value Ref Range   Glucose-Capillary 110 (H) 70 - 99 mg/dL    Comment: Glucose reference range applies only to samples taken after fasting for at least 8 hours.    DG Chest 1 View  Result Date: 04/25/2020 CLINICAL DATA:   86 year old male with acute left hip fracture status post fall. EXAM: CHEST  1 VIEW COMPARISON:  CT a chest, Abdomen, and Pelvis 03/27/2020 and earlier. FINDINGS: AP supine view at 1544 hours. Mild cardiomegaly and moderate tortuosity of the thoracic aorta. Other mediastinal contours are within normal limits. Visualized tracheal air column is within normal limits. Allowing for portable technique the lungs are clear. Multilevel chronic left lateral rib fractures beginning at the 5th rib level appear stable. IMPRESSION: No acute cardiopulmonary abnormality. Electronically Signed   By: Genevie Ann M.D.   On: 04/25/2020 16:19   CT Hip Left Wo Contrast  Result Date: 04/25/2020 CLINICAL DATA:  Status post fall, intertrochanteric versus femoral neck fracture on x-ray EXAM: CT OF THE LEFT HIP WITHOUT CONTRAST TECHNIQUE: Multidetector CT imaging of the left hip was performed according to the standard protocol. Multiplanar CT image reconstructions were also generated. COMPARISON:  X-ray hip 04/25/2020 FINDINGS: Bones/Joint/Cartilage Redemonstration of an impacted and comminuted intertrochanteric fracture of the left femur. No other acute fracture identified. No left hip dislocation. No suspicious lytic or blastic osseous lesions. Stable densely sclerotic lesion within the left iliac bone likely represents a bone island. Stable left acetabular sclerotic lesion likely represents an enchondroma. No cortical erosion or destruction. Ligaments Suboptimally assessed by CT. Muscles and Tendons Unremarkable. Soft tissues: Mild subcutaneus soft tissue edema. No significant hematoma formation. No organized fluid collection. Visualized portions of the pelvis: Partially visualized known left renal cyst. Colonic diverticulosis. Partially visualized distal abdominal aorta aneurysm measuring at least up to 4.3 cm. Atherosclerotic plaque. IMPRESSION: 1. Acute, impacted, and comminuted intertrochanteric left femoral fracture. 2. Partially  visualized distal abdominal aorta aneurysm measuring at least up to 4.3 cm. Known finding better evaluated on CT angiography 03/27/2020. Recommend follow-up every 12 months and vascular consultation if not already in place/followed up by vascular . This recommendation follows ACR consensus guidelines: White Paper of the ACR Incidental Findings Committee II on Vascular Findings. J Am Coll Radiol 2013; 10:789-794. 3. Aortic aneurysm NOS (ICD10-I71.9). Aortic Atherosclerosis (ICD10-I70.0). Electronically Signed   By: Iven Finn M.D.   On: 04/25/2020 18:13   DG Hip Unilat With Pelvis 2-3 Views Left  Result Date: 04/25/2020 CLINICAL DATA:  86 year old male status post fall with left hip pain. EXAM: DG HIP (WITH OR WITHOUT PELVIS) 2-3V LEFT COMPARISON:  [facility] 04/24/2018 CT Abdomen and Pelvis FINDINGS: comminuted left proximal femur distal neck fracture which likely extends into the intertrochanteric segment. Moderate impaction. Little to no angulation. Proximal left femoral shaft remains intact. No superimposed acute fracture of the pelvis identified. Proximal right femur appears grossly intact. Negative lower abdominal and pelvic visceral contours. Calcified femoral artery atherosclerosis. IMPRESSION: Acute fracture of the proximal left femur distal neck, likely with comminution into the intertrochanteric segment. Moderate impaction. Electronically Signed   By: Genevie Ann M.D.   On: 04/25/2020 16:18    Review of Systems  Constitutional: Negative.   Eyes: Negative.   Respiratory: Negative.   Cardiovascular: Negative.   Musculoskeletal:  Left hip pain  Skin: Negative.   Psychiatric/Behavioral: Negative.    Blood pressure 122/74, pulse 85, temperature 98.6 F (37 C), temperature source Oral, resp. rate 19, height 5\' 8"  (1.727 m), weight 68 kg, SpO2 96 %. Physical Exam Constitutional:      General: He is not in acute distress. HENT:     Head: Normocephalic.      Mouth/Throat:     Mouth: Mucous membranes are moist.  Eyes:     Extraocular Movements: Extraocular movements intact.  Cardiovascular:     Rate and Rhythm: Normal rate.     Pulses: Normal pulses.  Pulmonary:     Effort: Pulmonary effort is normal.  Abdominal:     General: Abdomen is flat.  Musculoskeletal:     Comments: Left leg shortened and rotated. No TTP left knee, leg or ankle.  Wiggles toes bilaterally. Able to ROM bilateral arms without difficulty.   Skin:    General: Skin is warm.  Neurological:     General: No focal deficit present.     Mental Status: He is alert.  Psychiatric:        Mood and Affect: Mood normal.     Assessment/Plan: Patient has a left intertrochanteric hip fracture.  CT scan was ordered due to impaction of the fracture for better understanding of the fracture pattern for surgical planning purposes.  It revealed an intertrochanteric hip fracture with impaction.  Patient is indicated for cephalomedullary nail of his  fracture.  We had a lengthy discussion with the patient and his son this morning.  They would like to proceed with surgery.  We will plan for Lovenox for DVT prophylaxis postoperatively while in the hospital and then transition to 325 mg aspirin daily for 1 month status post discharge.  Keep patient n.p.o.  Erle Crocker 04/26/2020, 9:42 AM

## 2020-04-26 NOTE — Transfer of Care (Signed)
Immediate Anesthesia Transfer of Care Note  Patient: Phillip West  Procedure(s) Performed: INTRAMEDULLARY (IM) NAIL INTERTROCHANTRIC (Left Hip)  Patient Location: PACU  Anesthesia Type:General  Level of Consciousness: drowsy and patient cooperative  Airway & Oxygen Therapy: Patient Spontanous Breathing  Post-op Assessment: Report given to RN, Post -op Vital signs reviewed and stable and Patient moving all extremities X 4  Post vital signs: Reviewed and stable  Last Vitals:  Vitals Value Taken Time  BP 110/62 04/26/20 1250  Temp    Pulse 71 04/26/20 1252  Resp 14 04/26/20 1252  SpO2 91 % 04/26/20 1252  Vitals shown include unvalidated device data.  Last Pain:  Vitals:   04/26/20 1250  TempSrc:   PainSc: Asleep         Complications: No complications documented.

## 2020-04-27 LAB — CBC
HCT: 34.3 % — ABNORMAL LOW (ref 39.0–52.0)
Hemoglobin: 11.7 g/dL — ABNORMAL LOW (ref 13.0–17.0)
MCH: 32.9 pg (ref 26.0–34.0)
MCHC: 34.1 g/dL (ref 30.0–36.0)
MCV: 96.3 fL (ref 80.0–100.0)
Platelets: 99 10*3/uL — ABNORMAL LOW (ref 150–400)
RBC: 3.56 MIL/uL — ABNORMAL LOW (ref 4.22–5.81)
RDW: 13.3 % (ref 11.5–15.5)
WBC: 10.1 10*3/uL (ref 4.0–10.5)
nRBC: 0.2 % (ref 0.0–0.2)

## 2020-04-27 LAB — BASIC METABOLIC PANEL
Anion gap: 9 (ref 5–15)
BUN: 12 mg/dL (ref 8–23)
CO2: 23 mmol/L (ref 22–32)
Calcium: 9 mg/dL (ref 8.9–10.3)
Chloride: 103 mmol/L (ref 98–111)
Creatinine, Ser: 0.86 mg/dL (ref 0.61–1.24)
GFR, Estimated: >60 mL/min (ref >60)
Glucose, Bld: 145 mg/dL — ABNORMAL HIGH (ref 70–99)
Potassium: 3.9 mmol/L (ref 3.5–5.1)
Sodium: 135 mmol/L (ref 135–145)

## 2020-04-27 LAB — GLUCOSE, CAPILLARY
Glucose-Capillary: 120 mg/dL — ABNORMAL HIGH (ref 70–99)
Glucose-Capillary: 139 mg/dL — ABNORMAL HIGH (ref 70–99)
Glucose-Capillary: 60 mg/dL — ABNORMAL LOW (ref 70–99)
Glucose-Capillary: 97 mg/dL (ref 70–99)
Glucose-Capillary: 167 mg/dL — ABNORMAL HIGH (ref 70–99)

## 2020-04-27 MED ORDER — LACTATED RINGERS IV BOLUS
1000.0000 mL | Freq: Once | INTRAVENOUS | Status: AC
  Administered 2020-04-27: 18:00:00 1000 mL via INTRAVENOUS

## 2020-04-27 NOTE — Progress Notes (Signed)
   Subjective:   Mr. Fronczak states surgery went well yesterday and he notes his leg pain is already improving. He is nervous to work with PT, but is eager to go home. He denies any difficulty breathing, chest pain, palpitations.   Objective:  Vital signs in last 24 hours: Vitals:   04/26/20 2005 04/26/20 2203 04/27/20 0020 04/27/20 0352  BP:  (!) 99/58 94/60 124/88  Pulse: 93 98 89 93  Resp: 16  15 17   Temp:   98.7 F (37.1 C) 98.8 F (37.1 C)  TempSrc:   Oral   SpO2:   93% 95%  Weight:      Height:       Physical Exam Vitals and nursing note reviewed.  Constitutional:      General: He is not in acute distress.    Appearance: He is normal weight.  Cardiovascular:     Rate and Rhythm: Normal rate. Rhythm irregular.  Pulmonary:     Effort: Pulmonary effort is normal. No respiratory distress.     Breath sounds: No wheezing, rhonchi or rales.  Abdominal:     General: Bowel sounds are normal.     Palpations: Abdomen is soft.  Skin:    General: Skin is warm and dry.  Neurological:     General: No focal deficit present.     Mental Status: He is alert and oriented to person, place, and time. Mental status is at baseline.  Psychiatric:        Mood and Affect: Mood normal.        Behavior: Behavior normal.    Assessment/Plan: [name] is a 86 y/o male with a PMHx of thoracic and abdominal aortic aneurysms, COPD, A. Fib not on AC, T2DM who presents to the ED s/p mechanical fall currently admitted for left femur fracture.  Active Problems:   Atrial fibrillation, chronic (HCC)   Tobacco abuse   COPD (chronic obstructive pulmonary disease) (HCC)   Thoracic aortic aneurysm (TAA) ([place])   Hypertension   Gross hematuria   Femur fracture, left ([place])  # Left Femur Fracture:  - Orthopedic surgery consulted; surgery on 12/11 - Dilaudid 0.5 q4h PRN for severe pain - Lovenox for DVT ppx  - PT/OT   # A. Fib, rate controlled  - Continue home Metoprolol 12.5 mg  BID  # T2DM  - SSI (sensitive)   # COPD  - Dulera BID  - Albuterol PRN   # Gross hematuria # Hx of Renal Cysts Chronic problem. Followed with [facility]. Notes he has cystoscopy and CT bladder. Unable to see records.   Prior to Admission Living Arrangement: Home Anticipated Discharge Location: Home Barriers to Discharge: Ongoing medical & surgical management Dispo: Anticipated discharge in approximately 1-2 day(s).   Dr. Jose Persia Internal Medicine PGY-2  Pager: [phone] After 5pm on weekdays and 1pm on weekends: On Call pager [phone]  04/27/2020, 7:06 AM

## 2020-04-27 NOTE — Plan of Care (Signed)

## 2020-04-27 NOTE — Evaluation (Signed)
Physical Therapy Evaluation Patient Details Name: Phillip West MRN: [id number] DOB: 28-Jul-1929 Today's Date: 04/27/2020   History of Present Illness  86 y.o male presenting S/p mechanical fall resulting in L femur fracture. Pt now s/p L IM nail 12/11. PMH includes abdominal aortic aneurysms, COPD, A. Fib not on AC, T2DM  Clinical Impression  Patient is s/p above surgery resulting in functional limitations due to the deficits listed below (see PT Problem List). Comes form home where he lives with his son in a single story house with 4 steps to enter; Independent at baseline, drives within the community (only during the day), no need for assistive device; Presents to PT/OT with Orhtostatic Hypotension limiting activity tolerance, decr independence and safety with functional mobility; While he wasn't able to walk far due to BP drop in standing, overall, his hip moves quite well; once Orthostatic Hypotension is under control, I anticipate good progress; Patient will benefit from skilled PT to increase their independence and safety with mobility to allow discharge to the venue listed below.       Follow Up Recommendations Home health PT;Supervision - Intermittent (Provided Orthostatic hypotension is under control; if slow to progress, will consider post-acute rehab)    Equipment Recommendations  Rolling walker with 5" wheels;3in1 (PT)    Recommendations for Other Services       Precautions / Restrictions Precautions Precautions: Fall Precaution Comments: Orthostaic BP drop during therapy evals Restrictions Weight Bearing Restrictions: No      Mobility  Bed Mobility Overal bed mobility: Needs Assistance Bed Mobility: Supine to Sit;Sit to Supine     Supine to sit: Min assist Sit to supine: Mod assist   General bed mobility comments: Min assist and guard for safety coming to sit EOB; Light mod assist to help LEs into bed    Transfers Overall transfer level: Needs  assistance Equipment used: Rolling walker (2 wheeled) Transfers: Sit to/from Stand Sit to Stand: Min assist;+2 safety/equipment         General transfer comment: Will need reinforcement of correct hand placement, tending to pull up on RW; min assist to steady RW; most of weight on RLE  Ambulation/Gait Ambulation/Gait assistance: Min assist;+2 safety/equipment Gait Distance (Feet): 2 Feet (forward and backward) Assistive device: Rolling walker (2 wheeled) Gait Pattern/deviations: Step-to pattern     General Gait Details: Cues for stepping sequence and to unweigh painful LLE in stance by pushing down into RW; distance limited by dizziness concurrent with BP drop with standing/upright activity  Stairs            Wheelchair Mobility    Modified Rankin (Stroke Patients Only)       Balance                                             Pertinent Vitals/Pain Pain Assessment: Faces Faces Pain Scale: Hurts little more Pain Location: L hip with weight bearing Pain Descriptors / Indicators: Aching;Grimacing Pain Intervention(s): Monitored during session;Repositioned    Home Living Family/patient expects to be discharged to:: Private residence   Available Help at Discharge: Family Type of Home: House Home Access: Stairs to enter Entrance Stairs-Rails: Right Entrance Stairs-Number of Steps: 4 Home Layout: One level Home Equipment: Shower seat;Grab bars - tub/shower      Prior Function Level of Independence: Independent         Comments: independent  in community- drives, Engineer, structural        Extremity/Trunk Assessment   Upper Extremity Assessment Upper Extremity Assessment: Defer to OT evaluation    Lower Extremity Assessment Lower Extremity Assessment: RLE deficits/detail RLE Deficits / Details: Grossly decr AROM and strength, limited by pain with weight bearing postop       Communication   Communication: No  difficulties  Cognition Arousal/Alertness: Awake/alert Behavior During Therapy: WFL for tasks assessed/performed Overall Cognitive Status: Within Functional Limits for tasks assessed                                        General Comments General comments (skin integrity, edema, etc.):   04/27/20 1450  Vital Signs  Patient Position (if appropriate) Orthostatic Vitals  Orthostatic Sitting  BP- Sitting 117/71  Orthostatic Standing at 0 minutes  BP- Standing at 0 minutes (!) 84/64  Orthostatic Standing at 3 minutes  BP- Standing at 3 minutes (!) 83/73   Dr. Charleen Kirks notified    Exercises     Assessment/Plan    PT Assessment Patient needs continued PT services  PT Problem List Decreased strength;Decreased range of motion;Decreased activity tolerance;Decreased balance;Decreased mobility;Decreased coordination;Decreased knowledge of use of DME;Decreased safety awareness;Decreased knowledge of precautions;Cardiopulmonary status limiting activity;Pain       PT Treatment Interventions DME instruction;Gait training;Stair training;Functional mobility training;Therapeutic activities;Therapeutic exercise;Balance training;Patient/family education;Wheelchair mobility training;Manual techniques    PT Goals (Current goals can be found in the Care Plan section)  Acute Rehab PT Goals Patient Stated Goal: Hopes to get home soon PT Goal Formulation: With patient Time For Goal Achievement: 05/11/20 Potential to Achieve Goals: Good    Frequency Min 6X/week   Barriers to discharge        Co-evaluation               AM-PAC PT "6 Clicks" Mobility  Outcome Measure Help needed turning from your back to your side while in a flat bed without using bedrails?: A Little Help needed moving from lying on your back to sitting on the side of a flat bed without using bedrails?: A Little Help needed moving to and from a bed to a chair (including a wheelchair)?: A Little Help  needed standing up from a chair using your arms (e.g., wheelchair or bedside chair)?: A Little Help needed to walk in hospital room?: A Lot Help needed climbing 3-5 steps with a railing? : Total 6 Click Score: 15    End of Session Equipment Utilized During Treatment: Gait belt Activity Tolerance: Treatment limited secondary to medical complications (Comment) (Orthostatic hypotension) Patient left: in bed;with call bell/phone within reach;with bed alarm set Nurse Communication: Mobility status PT Visit Diagnosis: Unsteadiness on feet (R26.81);Other abnormalities of gait and mobility (R26.89);Muscle weakness (generalized) (M62.81);History of falling (Z91.81)    Time: 1422-1510 PT Time Calculation (min) (ACUTE ONLY): 48 min   Charges:   PT Evaluation $PT Eval Moderate Complexity: 1 Mod          Roney Marion, [place]  Acute Rehabilitation Services Pager [phone] Office [phone]   [place] 04/27/2020, 5:25 PM

## 2020-04-27 NOTE — Evaluation (Signed)
Occupational Therapy Evaluation Patient Details Name: Phillip West MRN: [id number] DOB: [birth year] Today's Date: 04/27/2020    History of Present Illness 86 y.o male presenting S/p mechanical fall resulting in L femur fracture. Pt now s/p L IM nail 12/11. PMH includes abdominal aortic aneurysms, COPD, A. Fib not on AC, T2DM   Clinical Impression   PTA pt living with son and functioning at independent community level. At time of eval, pt able to complete bed mobility with min/mod A and sit <> stands with min A +2 and RW. Pt was limited in mobility 2/2 orthostatic hypotension this date (see vitals below). He was symptomatic as well. Pt currently requires max A to complete LB ADLs due to pain. Given current status, anticipate pt will progress to [facility] level and has son at home with him to assist in care. If orthostatic hypotension persists, may need to consider post acute rehab. Will continue to follow per POC listed below.   04/27/20 1450  Vital Signs  Patient Position (if appropriate) Orthostatic Vitals  Orthostatic Sitting  BP- Sitting 117/71  Orthostatic Standing at 0 minutes  BP- Standing at 0 minutes (!) 84/64  Orthostatic Standing at 3 minutes  BP- Standing at 3 minutes (!) 83/73       Follow Up Recommendations  Home health OT;Supervision/Assistance - 24 hour    Equipment Recommendations  3 in 1 bedside commode    Recommendations for Other Services       Precautions / Restrictions Precautions Precautions: Fall Precaution Comments: Orthostatic Restrictions Weight Bearing Restrictions: Yes LLE Weight Bearing: Weight bearing as tolerated      Mobility Bed Mobility Overal bed mobility: Needs Assistance Bed Mobility: Supine to Sit;Sit to Supine     Supine to sit: Min assist Sit to supine: Mod assist   General bed mobility comments: Min assist and guard for safety coming to sit EOB; Light mod assist to help LEs into bed    Transfers Overall transfer level:  Needs assistance Equipment used: Rolling walker (2 wheeled) Transfers: Sit to/from Stand Sit to Stand: Min assist;+2 safety/equipment         General transfer comment: Will need reinforcement of correct hand placement, tending to pull up on RW; min assist to steady RW; most of weight on RLE    Balance Overall balance assessment: Needs assistance Sitting-balance support: Feet supported Sitting balance-Leahy Scale: Good     Standing balance support: Bilateral upper extremity supported;During functional activity Standing balance-Leahy Scale: Poor Standing balance comment: reliant on external support and OT assist                           ADL either performed or assessed with clinical judgement   ADL Overall ADL's : Needs assistance/impaired Eating/Feeding: Sitting;Set up   Grooming: Set up;Sitting   Upper Body Bathing: Set up;Sitting   Lower Body Bathing: Maximal assistance;Sitting/lateral leans;Sit to/from stand   Upper Body Dressing : Set up;Sitting   Lower Body Dressing: Maximal assistance;Sitting/lateral leans;Sit to/from stand   Toilet Transfer: Minimal assistance;+2 for physical assistance;+2 for safety/equipment;Stand-pivot;BSC;RW   Toileting- Clothing Manipulation and Hygiene: Set up;Sitting/lateral lean       Functional mobility during ADLs: Minimal assistance;+2 for physical assistance;+2 for safety/equipment;Rolling walker General ADL Comments: orthostatic hypotension limiting mobility progression this date     Vision Patient Visual Report: No change from baseline       Perception     Praxis      Pertinent  Vitals/Pain Pain Assessment: Faces Faces Pain Scale: Hurts little more Pain Location: L hip with weight bearing Pain Descriptors / Indicators: Aching;Grimacing Pain Intervention(s): Limited activity within patient's tolerance;Monitored during session     Hand Dominance     Extremity/Trunk Assessment Upper Extremity  Assessment Upper Extremity Assessment: Overall WFL for tasks assessed   Lower Extremity Assessment Lower Extremity Assessment: Defer to PT evaluation RLE Deficits / Details: Grossly decr AROM and strength, limited by pain with weight bearing postop       Communication Communication Communication: No difficulties   Cognition Arousal/Alertness: Awake/alert Behavior During Therapy: WFL for tasks assessed/performed Overall Cognitive Status: Within Functional Limits for tasks assessed                                     General Comments      Exercises     Shoulder Instructions      Home Living Family/patient expects to be discharged to:: Private residence Living Arrangements: Children Available Help at Discharge: Family Type of Home: House Home Access: Stairs to enter;Ramped entrance Entrance Stairs-Number of Steps: 4 Entrance Stairs-Rails: Right Home Layout: One level     Bathroom Shower/Tub: Occupational psychologist: Handicapped height     Home Equipment: Shower seat;Grab bars - tub/shower          Prior Functioning/Environment Level of Independence: Independent        Comments: independent in community- drives, yardwork        OT Problem List: Decreased strength;Decreased knowledge of use of DME or AE;Cardiopulmonary status limiting activity;Decreased activity tolerance;Decreased knowledge of precautions;Impaired balance (sitting and/or standing);Pain      OT Treatment/Interventions: Self-care/ADL training;Therapeutic exercise;Patient/family education;Balance training;Energy conservation;Therapeutic activities;DME and/or AE instruction    OT Goals(Current goals can be found in the care plan section) Acute Rehab OT Goals Patient Stated Goal: Hopes to get home soon OT Goal Formulation: With patient Time For Goal Achievement: 05/11/20 Potential to Achieve Goals: Good  OT Frequency: Min 2X/week   Barriers to D/C:             Co-evaluation              AM-PAC OT "6 Clicks" Daily Activity     Outcome Measure Help from another person eating meals?: A Little Help from another person taking care of personal grooming?: A Little Help from another person toileting, which includes using toliet, bedpan, or urinal?: A Lot Help from another person bathing (including washing, rinsing, drying)?: A Lot Help from another person to put on and taking off regular upper body clothing?: A Little Help from another person to put on and taking off regular lower body clothing?: A Lot 6 Click Score: 15   End of Session Equipment Utilized During Treatment: Gait belt;Rolling walker Nurse Communication: Mobility status;Other (comment) (+orthostasis)  Activity Tolerance: Patient tolerated treatment well Patient left: in bed;with call bell/phone within reach  OT Visit Diagnosis: Unsteadiness on feet (R26.81);Other abnormalities of gait and mobility (R26.89);Muscle weakness (generalized) (M62.81);Pain Pain - Right/Left: Left Pain - part of body: Leg                Time: 1422-1500 OT Time Calculation (min): 38 min Charges:  OT General Charges $OT Visit: 1 Visit OT Evaluation $OT Eval Moderate Complexity: [place], MSOT, OTR/L Acute Rehabilitation Services [facility] Office Number: [phone] Pager: [phone]  Zenovia Jarred 04/27/2020, 6:48 PM

## 2020-04-28 ENCOUNTER — Encounter (HOSPITAL_COMMUNITY): Payer: Self-pay | Admitting: Orthopaedic Surgery

## 2020-04-28 DIAGNOSIS — I951 Orthostatic hypotension: Secondary | ICD-10-CM

## 2020-04-28 DIAGNOSIS — R310 Gross hematuria: Secondary | ICD-10-CM

## 2020-04-28 DIAGNOSIS — E119 Type 2 diabetes mellitus without complications: Secondary | ICD-10-CM

## 2020-04-28 DIAGNOSIS — I482 Chronic atrial fibrillation, unspecified: Secondary | ICD-10-CM

## 2020-04-28 DIAGNOSIS — I712 Thoracic aortic aneurysm, without rupture: Secondary | ICD-10-CM

## 2020-04-28 DIAGNOSIS — W19XXXA Unspecified fall, initial encounter: Secondary | ICD-10-CM

## 2020-04-28 DIAGNOSIS — S72002A Fracture of unspecified part of neck of left femur, initial encounter for closed fracture: Secondary | ICD-10-CM

## 2020-04-28 DIAGNOSIS — I714 Abdominal aortic aneurysm, without rupture: Secondary | ICD-10-CM

## 2020-04-28 DIAGNOSIS — J449 Chronic obstructive pulmonary disease, unspecified: Secondary | ICD-10-CM

## 2020-04-28 LAB — CBC
HCT: 32.1 % — ABNORMAL LOW (ref 39.0–52.0)
Hemoglobin: 11.1 g/dL — ABNORMAL LOW (ref 13.0–17.0)
MCH: 33.2 pg (ref 26.0–34.0)
MCHC: 34.6 g/dL (ref 30.0–36.0)
MCV: 96.1 fL (ref 80.0–100.0)
Platelets: 100 10*3/uL — ABNORMAL LOW (ref 150–400)
RBC: 3.34 MIL/uL — ABNORMAL LOW (ref 4.22–5.81)
RDW: 13.5 % (ref 11.5–15.5)
WBC: 10.4 10*3/uL (ref 4.0–10.5)
nRBC: 0 % (ref 0.0–0.2)

## 2020-04-28 LAB — GLUCOSE, CAPILLARY
Glucose-Capillary: 96 mg/dL (ref 70–99)
Glucose-Capillary: 178 mg/dL — ABNORMAL HIGH (ref 70–99)
Glucose-Capillary: 86 mg/dL (ref 70–99)

## 2020-04-28 LAB — BASIC METABOLIC PANEL
Anion gap: 9 (ref 5–15)
BUN: 15 mg/dL (ref 8–23)
CO2: 23 mmol/L (ref 22–32)
Calcium: 8.9 mg/dL (ref 8.9–10.3)
Chloride: 106 mmol/L (ref 98–111)
Creatinine, Ser: 0.73 mg/dL (ref 0.61–1.24)
GFR, Estimated: >60 mL/min (ref >60)
Glucose, Bld: 106 mg/dL — ABNORMAL HIGH (ref 70–99)
Potassium: 3.6 mmol/L (ref 3.5–5.1)
Sodium: 138 mmol/L (ref 135–145)

## 2020-04-28 MED ORDER — POLYETHYLENE GLYCOL 3350 17 G PO PACK
17.0000 g | PACK | Freq: Every day | ORAL | Status: DC
  Administered 2020-04-28 – 2020-05-01 (×4): 17 g via ORAL
  Filled 2020-04-28 (×4): qty 1

## 2020-04-28 MED ORDER — MIDODRINE HCL 5 MG PO TABS: 5.0000 mg | ORAL_TABLET | Freq: Three times a day (TID) | ORAL | Status: DC

## 2020-04-28 MED ORDER — LACTATED RINGERS IV BOLUS
1000.0000 mL | Freq: Once | INTRAVENOUS | Status: AC
  Administered 2020-04-28: 11:00:00 1000 mL via INTRAVENOUS

## 2020-04-28 MED ORDER — HYDROCODONE-ACETAMINOPHEN 5-325 MG PO TABS: 1.0000 | ORAL_TABLET | ORAL | 0 refills | Status: DC | PRN

## 2020-04-28 MED ORDER — ASPIRIN EC 81 MG PO TBEC: 325.0000 mg | DELAYED_RELEASE_TABLET | Freq: Every day | ORAL | 11 refills | Status: AC

## 2020-04-28 MED ORDER — MIDODRINE HCL 5 MG PO TABS
5.0000 mg | ORAL_TABLET | Freq: Once | ORAL | Status: AC
  Administered 2020-04-28: 11:00:00 5 mg via ORAL
  Filled 2020-04-28: qty 1

## 2020-04-28 NOTE — Progress Notes (Signed)
Physical Therapy Treatment Patient Details Name: Phillip West MRN: [id number] DOB: 1929-09-25 Today's Date: 04/28/2020    History of Present Illness 86 y.o male presenting S/p mechanical fall resulting in L femur fracture. Pt now s/p L IM nail 12/11. PMH includes abdominal aortic aneurysms, COPD, A. Fib not on AC, T2DM    PT Comments    Continuing work on functional mobility and activity tolerance;  Session focused on gently increasing activity, including perfomring therapeutic exercise for the L hip, and monitoring BPs with upright activity; Able to get OOB to recliner today after completing therex -- unfortunately, Phillip West still experienced lightheadedness in standing, and he did have BP drops as follows:     04/28/20 0933  Orthostatic Lying   BP- Lying 110/70 (82 MAP)  Pulse- Lying 87  Orthostatic Sitting  BP- Sitting 96/75 (83 MAP)  Pulse- Sitting 84  Orthostatic Standing at 0 minutes  BP- Standing at 0 minutes (!) 82/75 (64 MAP)  Pulse- Standing at 0 minutes 95  Orthostatic Standing at 3 minutes  BP- Standing at 3 minutes (!) 67/48 (55 MAP)  Pulse- Standing at 3 minutes 91    Discussed above BPs with Medical Team and Tu, RN  Follow Up Recommendations  Home health PT;Supervision/Assistance - 24 hour;Other (comment) (will depend on Orthostasis clearing; if slow progress, must consider post-acute rehab; I'm also curious if [facility] still [facility] -- that would be great for him if they still offer it)     Equipment Recommendations  Rolling walker with 5" wheels;3in1 (PT)    Recommendations for Other Services       Precautions / Restrictions Precautions Precautions: Fall Precaution Comments: Orthostatic Restrictions LLE Weight Bearing: Weight bearing as tolerated    Mobility  Bed Mobility Overal bed mobility: Needs Assistance Bed Mobility: Supine to Sit     Supine to sit: Min guard     General bed mobility comments: [place]  for safety and cues to self-monitor for activity tolerance, as well as to square off hips at EOB  Transfers Overall transfer level: Needs assistance Equipment used: Rolling walker (2 wheeled) Transfers: Sit to/from Stand Sit to Stand: Min assist         General transfer comment: Cues for hand placement and safety; Better rise with pushing off with UEs from seated surface  Ambulation/Gait Ambulation/Gait assistance: Min assist Gait Distance (Feet): 3 Feet (pivot steps bed to standing in frontof recliner) Assistive device: Rolling walker (2 wheeled) Gait Pattern/deviations: Step-to pattern     General Gait Details: Cues for stepping sequence and to unweigh painful LLE in stance by pushing down into RW; distance limited by dizziness concurrent with BP drop with standing/upright activity   Stairs             Wheelchair Mobility    Modified Rankin (Stroke Patients Only)       Balance     Sitting balance-Leahy Scale: Good     Standing balance support: Bilateral upper extremity supported;During functional activity Standing balance-Leahy Scale: Poor                              Cognition Arousal/Alertness: Awake/alert Behavior During Therapy: WFL for tasks assessed/performed Overall Cognitive Status: Within Functional Limits for tasks assessed  Exercises Total Joint Exercises Quad Sets: AROM;Right;Left;10 reps Gluteal Sets: AROM;Both;10 reps Towel Squeeze: AROM;Both;10 reps Short Arc Quad: AROM;Left;10 reps Heel Slides: AAROM;Left;10 reps Hip ABduction/ADduction: AAROM;Left;10 reps    General Comments        Pertinent Vitals/Pain Pain Assessment: Faces Faces Pain Scale: Hurts little more Pain Location: L hip with weight bearing, pain subsides quickly Pain Descriptors / Indicators: Aching;Grimacing Pain Intervention(s): Limited activity within patient's tolerance    Home Living                       Prior Function            PT Goals (current goals can now be found in the care plan section) Acute Rehab PT Goals Patient Stated Goal: Hopes to get home soon PT Goal Formulation: With patient Time For Goal Achievement: 05/11/20 Potential to Achieve Goals: Good Progress towards PT goals: Progressing toward goals (slowly; still with orthostatic hypotension)    Frequency    Min 6X/week      PT Plan Current plan remains appropriate;Other (comment) (provided Orthostatic hypotension clears)    Co-evaluation              AM-PAC PT "6 Clicks" Mobility   Outcome Measure  Help needed turning from your back to your side while in a flat bed without using bedrails?: A Little Help needed moving from lying on your back to sitting on the side of a flat bed without using bedrails?: None Help needed moving to and from a bed to a chair (including a wheelchair)?: A Little Help needed standing up from a chair using your arms (e.g., wheelchair or bedside chair)?: A Little Help needed to walk in hospital room?: A Little Help needed climbing 3-5 steps with a railing? : Total 6 Click Score: 17    End of Session Equipment Utilized During Treatment: Gait belt Activity Tolerance: Treatment limited secondary to medical complications (Comment) (Orthostatic hypotension) Patient left: in chair;with call bell/phone within reach;with chair alarm set Nurse Communication: Mobility status;Other (comment) (BP drop) PT Visit Diagnosis: Unsteadiness on feet (R26.81);Other abnormalities of gait and mobility (R26.89);Muscle weakness (generalized) (M62.81);History of falling (Z91.81)     Time: 2703-5009 PT Time Calculation (min) (ACUTE ONLY): 46 min  Charges:  $Gait Training: 8-22 mins $Therapeutic Exercise: 8-22 mins $Therapeutic Activity: 8-22 mins                     Roney Marion, PT  Acute Rehabilitation Services Pager [phone] Office [place] 04/28/2020, 12:37 PM

## 2020-04-28 NOTE — Discharge Instructions (Signed)
Hip Fracture  A hip fracture is a break in the upper part of the thigh bone (femur). This is usually the result of an injury, commonly a fall. What are the causes? This condition may be caused by:  A direct hit or injury (trauma) to the side of the hip, such as from a fall or a car accident. What increases the risk? You are more likely to develop this condition if:  You have poor balance or an unsteady walking pattern (gait). Certain conditions contribute to poor balance, including Parkinson disease and dementia.  You have thinning or weakening of your bones, such as from osteopenia or osteoporosis.  You have cancer that spreads to the leg bones.  You have certain conditions that can weaken your bones, such as thyroid disorders, intestine disorders, or a lack (deficiency) of certain nutrients.  You smoke.  You take certain medicines, such as steroids.  You have a history of broken bones. What are the signs or symptoms? Symptoms of this condition include:  Pain over the injured hip. This is commonly felt on the side of the hip or in the front groin area.  Stiffness, bruising, and swelling over the hip.  Pain with movement of the leg, especially lifting it up. Pain often gets better with rest.  Difficulty or inability to stand, walk, or use the leg to support body weight (put weight on the leg).  The leg rolling outward when lying down.  The affected leg being shorter than the other leg. How is this diagnosed? This condition may be diagnosed based on:  Your symptoms.  A physical exam.  X-rays. These may be done: ? To confirm the diagnosis. ? To determine the type and location of the fracture. ? To check for other injuries.  MRI or CT scans. These may be done if the fracture is not visible on an X-ray. How is this treated? Treatment for this condition depends on the severity and location of your fracture. In most cases, surgery is necessary. Surgery may  involve:  Repairing the fracture with a screw, nail, or rod to hold the bone in place (open reduction and internal fixation, ORIF).  Replacing the damaged parts of the femur with metal implants (hemiarthroplasty or arthroplasty). If your fracture is less severe, or if you are not eligible for surgery, you may have non-surgical treatment. Non-surgical treatment may involve:  Using crutches, a walker, or a wheelchair until your health care provider says that you can support (bear) weight on your hip.  Medicines to help reduce pain and swelling.  Having regular X-rays to monitor your fracture and make sure that it is healing.  Physical therapy. You may need physical therapy after surgery, too. Follow these instructions at home: Activity  Do not use your injured leg to support your body weight until your health care provider says that you can. ? Follow standing and walking restrictions as told by your health care provider. ? Use crutches, a walker, or a wheelchair as directed.  Avoid any activities that cause pain or irritation in your hip. Ask your health care provider what activities are safe for you.  Do not drive or use heavy machinery until your health care provider approves.  If physical therapy was prescribed, do exercises as told by your health care provider. General instructions  Take over-the-counter and prescription medicines only as told by your health care provider.  If directed, put ice on the injured area: ? Put ice in a plastic bag. ?   Place a towel between your skin and the bag. ? Leave the ice on for 20 minutes, 2-3 times a day.  Do not use any products that contain nicotine or tobacco, such as cigarettes and e-cigarettes. These can delay bone healing. If you need help quitting, ask your health care provider.  Keep all follow-up visits as told by your health care provider. This is important. How is this prevented?  To prevent falls at home: ? Use a cane, walker,  or wheelchair as directed. ? Make sure your rooms and hallways are free of clutter, obstacles, and cords. ? Install grab bars in your bedroom and bathrooms. ? Always use handrails when going up and down stairs. ? Use nightlights around the house.  Exercise regularly. Ask what forms of exercise are safe for you, such as walking and strength and balance exercises.  Visit an eye doctor regularly to have your eyesight checked. This can help prevent falls.  Make sure you get enough calcium and vitamin D.  Do not use any products that contain nicotine or tobacco, such as cigarettes and e-cigarettes. If you need help quitting, ask your health care provider.  Limit alcohol use.  If you have an underlying condition that caused your hip fracture, work with your health care provider to manage your condition. Contact a health care provider if:  Your pain gets worse or it does not get better with rest or medicine.  You develop any of the following in your leg or foot: ? Numbness. ? Tingling. ? A change in skin color (discoloration). ? Skin feeling cold to the touch. Get help right away if:  Your pain suddenly gets worse.  You cannot move your hip. Summary  A hip fracture is a break in the upper part of the thigh bone (femur).  Treatment typically require surgical management to restore stability and function to the hip.  Pain medicine and icing of the affected leg can help manage pain and swelling. Follow directions as told by your health care provider. This information is not intended to replace advice given to you by your health care provider. Make sure you discuss any questions you have with your health care provider. Document Revised: 01/21/2018 Document Reviewed: 06/05/2016 Elsevier Patient Education  2020 [employer].

## 2020-04-28 NOTE — Care Management (Cosign Needed)
    Durable Medical Equipment  (From admission, onward)         Start     Ordered   04/27/20 2002  For home use only DME 3 n 1  Once        04/27/20 2002   04/27/20 2002  For home use only DME Walker rolling  Once       Question Answer Comment  Walker: With Estelle Wheels   Patient needs a walker to treat with the following condition Femur fracture, left ([place])      04/27/20 2002

## 2020-04-28 NOTE — Progress Notes (Signed)
     Phillip West is a 86 y.o. male   Orthopaedic diagnosis: Left intertrochanteric hip fracture status post cephalomedullary nail on 04/26/2020  Subjective: Patient is doing well.  He is resting comfortably.  He is asking about discharge.  His pain is controlled.  He did mobilize some with physical therapy using a walker.  He is mentioning the [place] and his hopes to go home.  Objectyive: Vitals:   04/28/20 0358 04/28/20 0804  BP: (!) 105/59 116/73  Pulse: 85 87  Resp: 16 18  Temp: 98.6 F (37 C) 97.7 F (36.5 C)  SpO2: 95% 94%     Exam: Awake and alert Respirations even and unlabored No acute distress  Left hip dressing in place.  No shadowing.  He is able to range his hip without significant discomfort.  Is able to dorsiflex and plantarflex the ankle without pain or weakness.  Foot is warm and well-perfused distally.  No tenderness palpation with calf squeeze.  Assessment: Postop day 2 status post operative fixation of left intertrochanteric hip fracture, doing well   Plan: He is doing well.   He will continue to mobilize with physical therapy for disposition planning. He is suitable for discharge when appropriate per therapy and hospitalist team Continue Lovenox while in house and will discharge on 325 mg aspirin which has been ordered. Pain medication ordered for discharge. He will follow-up with me in 2 weeks for x-rays of the left hip and suture movable if appropriate.   Radene Journey, MD

## 2020-04-28 NOTE — Plan of Care (Signed)
  Problem: Education: Goal: Knowledge of General Education information will improve Description: Including pain rating scale, medication(s)/side effects and non-pharmacologic comfort measures Outcome: Progressing   Problem: Coping: Goal: Level of anxiety will decrease Outcome: Progressing   Problem: Safety: Goal: Ability to remain free from injury will improve Outcome: Progressing   Problem: Skin Integrity: Goal: Risk for impaired skin integrity will decrease Outcome: Progressing   Problem: Pain Managment: Goal: General experience of comfort will improve Outcome: Progressing   Problem: Elimination: Goal: Will not experience complications related to bowel motility Outcome: Progressing Goal: Will not experience complications related to urinary retention Outcome: Progressing

## 2020-04-28 NOTE — Progress Notes (Addendum)
Subjective: HD#3 Phillip West evaluated at bedside this AM. He states that pain has improved. Notes light-headedness, denied dizziness during his PT session this morning. He did take his morning metoprolol and believes this is a newer medication. No BM in the hospital and endorses chronic fluctuation from constipation to diarrhea and states last BM was about 3-4 days ago. Has mild pain with stools.   Objective:  Vital signs in last 24 hours: Vitals:   04/27/20 2326 04/27/20 2328 04/28/20 0358 04/28/20 0804  BP: 106/64  (!) 105/59 116/73  Pulse:  89 85 87  Resp:   16 18  Temp:   98.6 F (37 C) 97.7 F (36.5 C)  TempSrc:    Oral  SpO2:   95% 94%  Weight:      Height:       CBC Latest Ref Rng & Units 04/28/2020 04/27/2020 04/26/2020  WBC 4.0 - 10.5 K/uL 10.4 10.1 11.3(H)  Hemoglobin 13.0 - 17.0 g/dL 11.1(L) 11.7(L) 12.5(L)  Hematocrit 39.0 - 52.0 % 32.1(L) 34.3(L) 38.7(L)  Platelets 150 - 400 K/uL 100(L) 99(L) 107(L)   BMP Latest Ref Rng & Units 04/28/2020 04/27/2020 04/26/2020  Glucose 70 - 99 mg/dL 106(H) 145(H) -  BUN 8 - 23 mg/dL 15 12 -  Creatinine 0.61 - 1.24 mg/dL 0.73 0.86 0.86  Sodium 135 - 145 mmol/L 138 135 -  Potassium 3.5 - 5.1 mmol/L 3.6 3.9 -  Chloride 98 - 111 mmol/L 106 103 -  CO2 22 - 32 mmol/L 23 23 -  Calcium 8.9 - 10.3 mg/dL 8.9 9.0 -    Physical Exam Vitals and nursing note reviewed.  Constitutional:      General: He is not in acute distress.    Appearance: He is normal weight.  Cardiovascular:     Rate and Rhythm: Normal rate. Rhythm irregular.  Pulmonary:     Effort: Pulmonary effort is normal. No respiratory distress.     Breath sounds: No wheezing, rhonchi or rales.  Abdominal:     General: Bowel sounds are normal.     Palpations: Abdomen is soft.  Skin:    General: Skin is warm and dry.  Neurological:     General: No focal deficit present.     Mental Status: He is alert and oriented to person, place, and time. Mental status is at  baseline.  Psychiatric:        Mood and Affect: Mood normal.        Behavior: Behavior normal.    Assessment/Plan: Mr. Ramello Cordial is a 86 y/o male with a PMHx of thoracic and abdominal aortic aneurysms, COPD, A. Fib not on AC, T2DM who presents to the ED s/p mechanical fall currently admitted for left femur fracture POD#2.  Active Problems:   Atrial fibrillation, chronic (HCC)   Tobacco abuse   COPD (chronic obstructive pulmonary disease) (HCC)   Thoracic aortic aneurysm (TAA) (HCC)   Hypertension   Gross hematuria   Femur fracture, left ([place])  # Left Femur Fracture:  - Orthopedic surgery consulted; s/p left intertrochanteric nail on 12/11 - Dilaudid 0.5 q4h PRN for severe pain, currently not requiring this  - Lovenox for DVT ppx  - PT/OT recommending home health   # Orthostatic Hypotension Reports issues with dizziness started after being started on metoprolol.  While working with PT today, patient had drops in BP, standing at 0 minutes 82/75, @ 3 mins 67/48 with symptoms.   - Additional IV Fluid bolus 1L today (s/p  1L yesterday) -Hold Metoprolol -Midodrine 5 mg x1 after morning metoprolol dose; may need to consider scheduled midodrine if BP does not improve after holding beta blocker     # Chronic A. Fib, rate controlled  Not on anticoagulation for unclear reasons, possibly due to his chronic hematuria. We will hold off on initiating with his dizziness and orthostasis as he remains high risk for falls. Recently started on metoprolol per his outpatient cardiologist. Seems he is not able to tolerate this with his hypotension. If he develops RVR, may need cardiology consult for consideration of antiarrhythmic vs. Ablation. Avoid beta blockers and CCB with his hypotension.  - Hold home Metoprolol 12.5 mg BID - Telemetry    # T2DM  - SSI (sensitive)    # COPD  - Dulera BID  - Albuterol PRN   # Gross hematuria # Hx of Renal Cysts Chronic problem. Followed with [facility]. Notes he has cystoscopy and CT bladder. Unable to see records.   Prior to Admission Living Arrangement: Home Anticipated Discharge Location: Home Barriers to Discharge: Ongoing medical management Dispo: Anticipated discharge in approximately 1-2 day(s).   Honor Junes, MD Pager: [phone] After 5pm on weekdays and 1pm on weekends: On Call pager [phone]  04/28/2020, 3:46 PM

## 2020-04-28 NOTE — TOC Initial Note (Addendum)
Transition of Care [facility]) - Initial/Assessment Note    Patient Details  Name: Phillip West MRN: [id number] Date of Birth: 13-Sep-1929  Transition of Care [facility]) CM/SW Contact:    Curlene Labrum, RN Phone Number: 04/28/2020, 12:12 PM  Clinical Narrative:                 Case management met with the patient at the bedside regarding transitions of care.  The patient states that he would like to be admitted to [place] or discharge to home with home health if unable to admit to the [place] facility.  The patient currently lives at his own home with his son at a single level home.  The patient does not use dme at this time but will need a RW and 3:1 if he is discharged home.  The patient is fully vaccinated for COVID with Moderna vaccine including the booster.  I called and left a message with April at the [facility] and notified them of the patient's admission on 04/25/2020 and possible need for CIR versus home with home health.  Will continue to follow the patient for [place] admission versus home with home health.  12/13-2021- Case management is waiting to hear back from CIR at [facility] to see if patient will quality for their program.  The patient was given choice for home health as a back up plan for discharge and patient did not have a preference.  I called [facility] and spoke with Tommi Rumps, [facility] and [facility]- and that program is currently on hold due to staffing issues.  The patient was accepted for home health with [facility] for Pt/OT and aide in the meantime.  Patient will need dme ordered if he discharges home with home health services.  Expected Discharge Plan: [place] Services Barriers to Discharge: Other (comment),Insurance Authorization   Patient Goals and CMS Choice Patient states their goals for this hospitalization and ongoing recovery are:: Patient plans to discharge to [place] versus home with home health. CMS Medicare.gov Compare Post Acute Care list provided to:: Patient Choice offered  to / list presented to : Patient  Expected Discharge Plan and Services Expected Discharge Plan: [place]   Discharge Planning Services: CM Consult Post Acute Care Choice: Durable Medical Equipment,Home Health,IP Rehab Living arrangements for the past 2 months: Single Family Home                                      Prior Living Arrangements/Services Living arrangements for the past 2 months: Single Family Home Lives with:: Adult Children Patient language and need for interpreter reviewed:: Yes Do you feel safe going back to the place where you live?: Yes      Need for Family Participation in Patient Care: Yes (Comment) Care giver support system in place?: Yes (comment)   Criminal Activity/Legal Involvement Pertinent to Current Situation/Hospitalization: No - Comment as needed  Activities of Daily Living Home Assistive Devices/Equipment: Crutches,Walker (specify type) (front whee walker) ADL Screening (condition at time of admission) Patient's cognitive ability adequate to safely complete daily activities?: Yes Is the patient deaf or have difficulty hearing?: No Does the patient have difficulty seeing, even when wearing glasses/contacts?: No Does the patient have difficulty concentrating, remembering, or making decisions?: No Patient able to express need for assistance with ADLs?: Yes Does the patient have difficulty dressing or bathing?: No Independently  performs ADLs?: Yes (appropriate for developmental age) Does the patient have difficulty walking or climbing stairs?: No Weakness of Legs: Left Weakness of Arms/Hands: None  Permission Sought/Granted Permission sought to share information with : Case Manager Permission granted to share information with : Yes, Verbal Permission Granted     Permission granted to share info w AGENCY: CIR at [place], [place] versus Home with home health services.  Permission granted to share info w Relationship: son -  Phillip West - [phone]     Emotional Assessment Appearance:: Appears stated age Attitude/Demeanor/Rapport: Gracious Affect (typically observed): Accepting Orientation: : Oriented to Self,Oriented to Place,Oriented to  Time,Oriented to Situation Alcohol / Substance Use: Tobacco Use Psych Involvement: No (comment)  Admission diagnosis:  Femur fracture, left ([place]) [S72.92XA] Fall, initial encounter [W19.XXXA] Closed left hip fracture, initial encounter [facility]) [S72.002A] Patient Active Problem List   Diagnosis Date Noted  . Femur fracture, left ([place]) 04/25/2020  . Thoracic aortic aneurysm (TAA) ([place])   . Chalazion right lower eyelid   . Hypertension   . Gross hematuria   . Diabetes mellitus ([place])   . Atrial fibrillation ([place])   . Essential hypertension 07/14/2018  . Hyperlipidemia 07/14/2018  . Atrial fibrillation, chronic ([place]) 07/14/2018  . Abdominal aortic aneurysm (AAA) ([place]) 07/14/2018  . Tobacco abuse 07/14/2018  . COPD (chronic obstructive pulmonary disease) ([place]) 07/14/2018   PCP:  Windy Fast, MD Pharmacy:   [place] [place] [facility] [address] [place] [phone] [place] [address] Phone: [phone] Fax: [phone]     Social Determinants of Health (SDOH) Interventions    Readmission Risk Interventions Readmission Risk Prevention Plan 04/28/2020  Post Dischage Appt Complete  Medication Screening Complete  Transportation Screening Complete  Some recent data might be hidden

## 2020-04-29 LAB — GLUCOSE, CAPILLARY
Glucose-Capillary: 126 mg/dL — ABNORMAL HIGH (ref 70–99)
Glucose-Capillary: 175 mg/dL — ABNORMAL HIGH (ref 70–99)
Glucose-Capillary: 95 mg/dL (ref 70–99)
Glucose-Capillary: 146 mg/dL — ABNORMAL HIGH (ref 70–99)

## 2020-04-29 LAB — CBC
HCT: 34 % — ABNORMAL LOW (ref 39.0–52.0)
Hemoglobin: 11.7 g/dL — ABNORMAL LOW (ref 13.0–17.0)
MCH: 33.1 pg (ref 26.0–34.0)
MCHC: 34.4 g/dL (ref 30.0–36.0)
MCV: 96 fL (ref 80.0–100.0)
Platelets: 121 10*3/uL — ABNORMAL LOW (ref 150–400)
RBC: 3.54 MIL/uL — ABNORMAL LOW (ref 4.22–5.81)
RDW: 13.5 % (ref 11.5–15.5)
WBC: 9.4 10*3/uL (ref 4.0–10.5)
nRBC: 0 % (ref 0.0–0.2)

## 2020-04-29 MED ORDER — MIDODRINE HCL 5 MG PO TABS
5.0000 mg | ORAL_TABLET | Freq: Three times a day (TID) | ORAL | Status: DC
  Administered 2020-04-29 – 2020-05-01 (×7): 5 mg via ORAL
  Filled 2020-04-29 (×7): qty 1

## 2020-04-29 NOTE — Progress Notes (Signed)
   04/29/20 0951  Orthostatic Lying   BP- Lying 108/64  Pulse- Lying 99  Orthostatic Sitting  BP- Sitting 108/75 (MAP 85)  Pulse- Sitting 86  Orthostatic Standing at 0 minutes  BP- Standing at 0 minutes (!) 86/57 (MAP 68)  Pulse- Standing at 0 minutes 88  Orthostatic Standing at 3 minutes  BP- Standing at 3 minutes (!) 74/52 (MAP 60)  Pulse- Standing at 3 minutes 77 (after 3 mins and short distance in room amb)

## 2020-04-29 NOTE — Progress Notes (Signed)
Paged in regards to A. Fib with RVR. Telemetry personally reviewed. Patient with sustained HR in the 120s (up to 140s although with motion degradation) starting in the early afternoon. Per RN, he is asymptomatic.   This is likely in the setting of discontinued Metoprolol due to symptomatic orthostatic hypotension.  Patient will be unlikely to be able to tolerate BB or CCB. Will place consult to cardiology for additional assistance.    Dr. Jose Persia Internal Medicine PGY-2  Pager: [phone] After 5pm on weekdays and 1pm on weekends: On Call pager [phone]  04/29/2020, 3:17 PM

## 2020-04-29 NOTE — Progress Notes (Signed)
Physical Therapy Treatment Patient Details Name: Phillip West MRN: [id number] DOB: 13-Feb-1930 Today's Date: 04/29/2020    History of Present Illness 86 y.o male presenting S/p mechanical fall resulting in L femur fracture. Pt now s/p L IM nail 12/11. PMH includes abdominal aortic aneurysms, COPD, A. Fib not on AC, T2DM    PT Comments    Continuing work on functional mobility and activity tolerance;  Session focused on safe initiation of progressive amb -- kept recliner close in case his lightheadedness worsened; Still with orthostatic BP drop with upright/standing activity, RN and Medical Team notified, but his symptoms didn't completely preclude ambulation today -- just need VERY CLOSE MONITORING of his activity tolerance; Variable HR related to afib; MDs aware;   Phillip West still clearly states he wants to get home, and at this point, I believe it is worth considering a post-acute rehab stay to maximize independence and safety with mobility, and ADLs to allow for safe transition home; Discussed with TOC RN and CIR Adm Coord   Follow Up Recommendations  CIR;Other (comment) (notified TOC and CIR Adm Coord)     Equipment Recommendations  Rolling walker with 5" wheels;3in1 (PT)    Recommendations for Other Services       Precautions / Restrictions Precautions Precautions: Fall Precaution Comments: Orthostatic, and some tachycardia Restrictions LLE Weight Bearing: Weight bearing as tolerated    Mobility  Bed Mobility Overal bed mobility: Needs Assistance Bed Mobility: Supine to Sit     Supine to sit: Min assist     General bed mobility comments: Min handheld assist for safety and to pull to sit; cues to self-monitor for activity tolerance, as well as to square off hips at EOB; got up on R side of bed  Transfers Overall transfer level: Needs assistance Equipment used: Rolling walker (2 wheeled) Transfers: Sit to/from Stand Sit to Stand: Min guard         General  transfer comment: Cues for hand placement and safety; Better rise with pushing off with UEs from seated surface  Ambulation/Gait Ambulation/Gait assistance: Min assist Gait Distance (Feet): 20 Feet Assistive device: Rolling walker (2 wheeled) Gait Pattern/deviations: Step-through pattern (emerging)     General Gait Details: Cues for stepping sequence and to unweigh painful LLE in stance by pushing down into RW; Close monitor and cues to self-monitor for activity tolerance; chair pulled behind for safety   Stairs             Wheelchair Mobility    Modified Rankin (Stroke Patients Only)       Balance     Sitting balance-Leahy Scale: Good       Standing balance-Leahy Scale: Poor                              Cognition Arousal/Alertness: Awake/alert Behavior During Therapy: WFL for tasks assessed/performed Overall Cognitive Status: Within Functional Limits for tasks assessed                                        Exercises Total Joint Exercises Quad Sets: AROM;Right;Left;10 reps Heel Slides: AAROM;Left;10 reps    General Comments General comments (skin integrity, edema, etc.): See other note for Orthostatic BPs      Pertinent Vitals/Pain Pain Assessment: 0-10 Pain Score: 6  Pain Location: L hip getting in to EOB, subsided relatively  quickly Pain Descriptors / Indicators: Aching;Grimacing Pain Intervention(s): Limited activity within patient's tolerance    Home Living                      Prior Function            PT Goals (current goals can now be found in the care plan section) Acute Rehab PT Goals Patient Stated Goal: Hopes to get home soon PT Goal Formulation: With patient Time For Goal Achievement: 05/11/20 Potential to Achieve Goals: Good Progress towards PT goals: Progressing toward goals    Frequency    Min 6X/week      PT Plan Other (comment);Discharge plan needs to be updated (Consider  switching gears to post-acute rehab)    Co-evaluation              AM-PAC PT "6 Clicks" Mobility   Outcome Measure  Help needed turning from your back to your side while in a flat bed without using bedrails?: A Little Help needed moving from lying on your back to sitting on the side of a flat bed without using bedrails?: None Help needed moving to and from a bed to a chair (including a wheelchair)?: A Little Help needed standing up from a chair using your arms (e.g., wheelchair or bedside chair)?: A Little Help needed to walk in hospital room?: A Little Help needed climbing 3-5 steps with a railing? : Total 6 Click Score: 17    End of Session Equipment Utilized During Treatment: Gait belt Activity Tolerance: Treatment limited secondary to medical complications (Comment) (Orthostatic hypotension) Patient left: in chair;with call bell/phone within reach;with chair alarm set Nurse Communication: Mobility status;Other (comment) (BP drop) PT Visit Diagnosis: Unsteadiness on feet (R26.81);Other abnormalities of gait and mobility (R26.89);Muscle weakness (generalized) (M62.81);History of falling (Z91.81)     Time: 6546-5035 PT Time Calculation (min) (ACUTE ONLY): 46 min  Charges:  $Gait Training: 8-22 mins $Therapeutic Activity: 23-37 mins                     Roney Marion, PT  Acute Rehabilitation Services Pager [phone] Office [place] 04/29/2020, 11:24 AM

## 2020-04-29 NOTE — Progress Notes (Addendum)
Subjective: POD 3 Patient seen with PT present. He reports pain is well controlled, though had moderate pain overnight (not requiring PRN Dilaudid). Endorses lightheadedness and dizziness during PT session today.   Objective:  Vital signs in last 24 hours: Vitals:   04/29/20 0837 04/29/20 0854 04/29/20 0951 04/29/20 1346  BP: (!) 77/63  108/64 (!) 105/59  Pulse:   (!) 109 93  Resp:   18 18  Temp:   97.7 F (36.5 C) 97.8 F (36.6 C)  TempSrc:   Oral Oral  SpO2:  96% 96% 98%  Weight:      Height:       Weight change:   Intake/Output Summary (Last 24 hours) at 04/29/2020 1429 Last data filed at 04/29/2020 1400 Gross per 24 hour  Intake 480 ml  Output 1575 ml  Net -1095 ml   CBC Latest Ref Rng & Units 04/29/2020 04/28/2020 04/27/2020  WBC 4.0 - 10.5 K/uL 9.4 10.4 10.1  Hemoglobin 13.0 - 17.0 g/dL 11.7(L) 11.1(L) 11.7(L)  Hematocrit 39.0 - 52.0 % 34.0(L) 32.1(L) 34.3(L)  Platelets 150 - 400 K/uL 121(L) 100(L) 99(L)   CMP Latest Ref Rng & Units 04/28/2020 04/27/2020 04/26/2020  Glucose 70 - 99 mg/dL 106(H) 145(H) -  BUN 8 - 23 mg/dL 15 12 -  Creatinine 0.61 - 1.24 mg/dL 0.73 0.86 0.86  Sodium 135 - 145 mmol/L 138 135 -  Potassium 3.5 - 5.1 mmol/L 3.6 3.9 -  Chloride 98 - 111 mmol/L 106 103 -  CO2 22 - 32 mmol/L 23 23 -  Calcium 8.9 - 10.3 mg/dL 8.9 9.0 -   Orthostatic VS for the past 24 hrs:  BP- Lying Pulse- Lying BP- Sitting Pulse- Sitting BP- Standing at 0 minutes Pulse- Standing at 0 minutes  04/29/20 1000 -- -- 122/74 87 -- --  04/29/20 0951 108/64 99 108/75 86 (!) 86/57 88   General: Comfortable appearing. No acute distress. Pleasant and conversant. CV: Irregular rhythm Pulmonary: Normal effort. No respiratory distress. Skin: warm and dry Neuro: grossly non-focal Psych: Normal mood and affect.   Assessment/Plan: [name] is a 86 y/o male with a PMHx ofthoracic and abdominal aortic aneurysms, COPD, A. Fib, and T2DM who presents to the ED s/p  mechanical fall currently admitted for left femur fracture POD#3 from intertrochanteric nail.   Active Problems:   Atrial fibrillation, chronic (HCC)   Tobacco abuse   COPD (chronic obstructive pulmonary disease) (HCC)   Thoracic aortic aneurysm (TAA) (HCC)   Hypertension   Gross hematuria   Femur fracture, left (HCC)   Orthostatic hypotension  # Left Femur Fracture: - Orthopedic surgery consulted; s/p left intertrochanteric nail on 12/11 - Dilaudid 0.5 q4h PRN for severe pain, currently not requiring this  - Lovenox for DVT ppx  -CIR consult placed  # Orthostatic Hypotension Patient orthorostasis during PT session persists, following holding metoprolol. -Start Midodrine 5 mg TID with meals. Monitor for improvement  # Chronic A. Fib, rate controlled Had started on metoprolol per his outpatient cardiologist, but was not able to tolerate this given continued hypotension. -In light of his new-onset RVR, cardiology consult placed -Telemetry    # T2DM - SSI (sensitive)  # COPD - Dulera BID  - Albuterol PRN  # Gross hematuria # Hx of Renal Cysts Chronic problem. Followed with [facility]. Notes he has cystoscopy and CT bladder. Unable to see records.   Prior to Admission Living Arrangement: Home Anticipated Discharge Location: Home Barriers to Discharge: Ongoing medical  management Dispo: Anticipated discharge in approximately 1-2 day(s).    LOS: 4 days   Azell Der, Medical Student 04/29/2020, 2:29 PM

## 2020-04-29 NOTE — Progress Notes (Signed)
   04/29/20 0951  Assess: MEWS Score  Temp 97.7 F (36.5 C)  BP 108/64  Pulse Rate (!) 109  Resp 18  Level of Consciousness Alert  SpO2 96 %  O2 Device Room Air  Assess: MEWS Score  MEWS Temp 0  MEWS Systolic 0  MEWS Pulse 1  MEWS RR 0  MEWS LOC 0  MEWS Score 1  MEWS Score Color Green  Assess: if the MEWS score is Yellow or Red  Were vital signs taken at a resting state? Yes  Document  Patient Outcome Stabilized after interventions  Progress note created (see row info) Yes  Patient assessed with soft BP of 77/63 at 0837 during orthostatic vitals this a.m. while standing.  Patient reported dizziness and lightheadedness, but no change of LOC assessed.  Pt vitals were re-assessed in a resting state approximately an hour later with BP notably improved.  MD and Charge RN Sharyn Lull notified of temporary yellow mews status, but pt is now green after re-assessment an hour after initial vitals.  Pt is asymptomatic and will continue to monitor for any acute events.

## 2020-04-29 NOTE — TOC Progression Note (Signed)
Transition of Care [facility]) - Progression Note    Patient Details  Name: Phillip West MRN: [id number] Date of Birth: Nov 11, 1929  [facility] of [facility] CM/SW [place], RN Phone Number: 04/29/2020, 12:19 PM  Clinical Narrative:    Case management spoke with the patient at the bedside regarding transitions of care to CIR at [facility] in [place] and the patient is agreeable to placement under his traditional Medicare.  Shann Medal, CM with CIR is following the patient as well for possible admission to CIR at [facility] as well.    Case management will continue to follow the patient for CIR admission opportunities based on bed availability.   Expected Discharge Plan: [place] [facility] Barriers to Discharge: Other (comment),Insurance Authorization  Expected Discharge Plan and Services Expected Discharge Plan: [place]   Discharge Planning Services: CM Consult Post Acute Care Choice: Durable Medical Equipment,Home Health,IP Rehab Living arrangements for the past 2 months: Single Family Home                                       Social Determinants of Health (SDOH) Interventions    Readmission Risk Interventions Readmission Risk Prevention Plan 04/28/2020  Post Dischage Appt Complete  Medication Screening Complete  Transportation Screening Complete  Some recent data might be hidden

## 2020-04-29 NOTE — Progress Notes (Signed)
Inpatient Rehab Admissions Coordinator Note:   Per PT request, pt was screened for CIR candidacy by Shann Medal, PT, DPT.  At this time we are recommending a CIR consult and I will request and order per our protocol.  Please contact me with questions.   Shann Medal, PT, DPT [phone] 04/29/20 11:20 AM

## 2020-04-30 DIAGNOSIS — I4821 Permanent atrial fibrillation: Secondary | ICD-10-CM

## 2020-04-30 LAB — CBC WITH DIFFERENTIAL/PLATELET
Abs Immature Granulocytes: 0.06 10*3/uL (ref 0.00–0.07)
Basophils Absolute: 0.1 10*3/uL (ref 0.0–0.1)
Basophils Relative: 1 %
Eosinophils Absolute: 0.2 10*3/uL (ref 0.0–0.5)
Eosinophils Relative: 2 %
HCT: 35.5 % — ABNORMAL LOW (ref 39.0–52.0)
Hemoglobin: 11.7 g/dL — ABNORMAL LOW (ref 13.0–17.0)
Immature Granulocytes: 1 %
Lymphocytes Relative: 20 %
Lymphs Abs: 1.8 10*3/uL (ref 0.7–4.0)
MCH: 32 pg (ref 26.0–34.0)
MCHC: 33 g/dL (ref 30.0–36.0)
MCV: 97 fL (ref 80.0–100.0)
Monocytes Absolute: 1.1 10*3/uL — ABNORMAL HIGH (ref 0.1–1.0)
Monocytes Relative: 12 %
Neutro Abs: 5.5 10*3/uL (ref 1.7–7.7)
Neutrophils Relative %: 64 %
Platelets: 143 10*3/uL — ABNORMAL LOW (ref 150–400)
RBC: 3.66 MIL/uL — ABNORMAL LOW (ref 4.22–5.81)
RDW: 13.3 % (ref 11.5–15.5)
WBC: 8.6 10*3/uL (ref 4.0–10.5)
nRBC: 0 % (ref 0.0–0.2)

## 2020-04-30 LAB — GLUCOSE, CAPILLARY
Glucose-Capillary: 107 mg/dL — ABNORMAL HIGH (ref 70–99)
Glucose-Capillary: 177 mg/dL — ABNORMAL HIGH (ref 70–99)
Glucose-Capillary: 82 mg/dL (ref 70–99)
Glucose-Capillary: 123 mg/dL — ABNORMAL HIGH (ref 70–99)

## 2020-04-30 MED ORDER — DIGOXIN 125 MCG PO TABS
0.1250 mg | ORAL_TABLET | Freq: Every day | ORAL | Status: DC
  Administered 2020-04-30 – 2020-05-01 (×2): 0.125 mg via ORAL
  Filled 2020-04-30 (×2): qty 1

## 2020-04-30 NOTE — Progress Notes (Signed)
   Subjective: POD4 Patient in new-onset RVR yesterday afternoon, with cardiology subsequently consulted for aid in rate control considering his inability to tolerate beta blockers or calcium channel blockers as a result of his orthostatic hypotension. Seen by Cardiology with recommendations to start digoxin 0.125 mg daily for rate control.    Today, patient reports sustained lightheadedness. Pain well controlled, with patient not requiring Dilaudid.  Denies chest pain or palpitations.   Objective:  Vital signs in last 24 hours: Vitals:   04/29/20 1449 04/29/20 2023 04/30/20 0500 04/30/20 0859  BP: 120/68 104/67 112/70 105/69  Pulse:  87 100 97  Resp: 18 15 17 17   Temp: 97.8 F (36.6 C) 98.9 F (37.2 C) 98.6 F (37 C) 98.1 F (36.7 C)  TempSrc: Oral   Oral  SpO2: 98% 94% 95% 93%  Weight:      Height:       Weight change:   Intake/Output Summary (Last 24 hours) at 04/30/2020 1323 Last data filed at 04/30/2020 0900 Gross per 24 hour  Intake 480 ml  Output 1400 ml  Net -920 ml   Orthostatic VS for the past 24 hrs:  BP- Lying BP- Sitting BP- Standing at 0 minutes  04/30/20 1100 96/67 92/56 90/44     General: Well-nourished. Seen lying in bed comfortably. No acute distress CV: Normal S1, S2; irregular rhythm Pulmonary: CTAB Skin: Warm and dry Psych: Alert and oriented x3. Normal mood and affect. Judgement intact.    Assessment/Plan:  Active Problems:   Atrial fibrillation, chronic (HCC)   Tobacco abuse   COPD (chronic obstructive pulmonary disease) (HCC)   Thoracic aortic aneurysm (TAA) (HCC)   Hypertension   Gross hematuria   Femur fracture, left (HCC)   Orthostatic hypotension  # Left Femur Fracture: - Orthopedic surgery;s/p left intertrochanteric nailon 12/11 - Dilaudid 0.5 q4h PRN for severe pain, still not requiring this - Lovenox for DVT ppx  -CIR consult placed  #Orthostatic Hypotension Patient orthorostasis during PT session persists, with still  holding metoprolol. Patient consequently is unable to tolerate beta blockers or calcium channel blockers. -Continue Midodrine 5 mg TID with meals. Monitor for improvement.   #ChronicA. Fib, rate controlled Had started on metoprolol per his outpatient cardiologist, but is not able to tolerate this given continued orthostatic hypotension. Patient had new-onset RVR yesterday, with cardiology consult subsequently placed -Started digoxin 0.125 mg daily for rate control per Cardiology; appreciate recommendations.   -Telemetry  # T2DM - SSI (sensitive)  # COPD - Dulera BID  - Albuterol PRN  # Gross hematuria # Hx of Renal Cysts Chronic problem. Followed with [facility]. Notes he has cystoscopy and CT bladder. Unable to see records.      LOS: 5 days   Azell Der, Medical Student 04/30/2020, 1:23 PM

## 2020-04-30 NOTE — Consult Note (Signed)
Cardiology Consultation:   Patient ID: Phillip West MRN: [id number]; DOB: Jan 02, 1930  Admit date: 04/25/2020 Date of Consult: 04/30/2020  Primary Care Provider: Windy Fast, MD [facility] Cardiologist: [facility]   Patient Profile:   Phillip West is a 86 y.o. male with a hx of dilated ascending aortic ( followed by Dr. Servando Snare), AI, abdominal aortic aneurysms (follwed by Dr. Donzetta Matters)  permeant atrial fibrillation, not on anticoagulation 2nd to hematuria, COPD, HLD and HTN  who is being seen today for the evaluation of atrial fibrillation with RVR  at the request of Dr. Lucrezia Europe.   He is retired from [employer] as well as the [employer] system.    Since once by Dr. Gwenlyn Found for evaluation of infra abdominal aortic aneurysm and atrial fibrillation.  Echo 03/2019 with normal LVEF, moderate AI, dilated aorta and moderate LA enlargement.   He has 5.2cm dilated ascending aorta and moderate aortic insufficieny. This is followed by Dr. Servando Snare. High risk for surgery given age, afib and pulmonary dx. Patient not interested in major surgery per note.   History of Present Illness:   [name] presented 04/25/20 after mechanical fall who found to have left femur fracture s/p Left intertrochanteric nail 12/11. He was on metoprolol 12.5mg  BID however stopped 04/28/20 due to positive orthostatic during PT and persistent hypotension. Started on midodrine and given fluid bolus however BP remain soft.   Patient has chronic atrial fibrillation and yesterday noted elevated heart rate.  Felt it was due to discontinuation of metoprolol.  Cardiology is asked for further evaluation.   Upon review of telemetry, he was in atrial fibrillation on ER arrival with controlled heart rate in 70s to 80s.  He was not placed on telemetry post surgery.  Since yesterday his heart rate is fluctuating between 90-120s.  This morning it went up to 180s.  Patient is completely asymptomatic.   Currently his heart rate in 90-100s.   Patient reports chronic dizziness upon standing.  Previously on higher dose of metoprolol which required reduction because of orthostasis.  He is follows by cardiology at [facility].  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  He may have felt dizziness.  Same symptoms consistent with orthostasis.  Past Medical History:  Diagnosis Date   Abdominal aortic aneurysm (AAA) (HCC)    Atrial fibrillation ([place])    Chalazion right lower eyelid    COPD (chronic obstructive pulmonary disease) (HCC)    Diabetes mellitus ([place])    Gross hematuria    Hypertension    Syncope and collapse    Thoracic aortic aneurysm (TAA) ([place])     Past Surgical History:  Procedure Laterality Date   HERNIA REPAIR     INTRAMEDULLARY (IM) NAIL INTERTROCHANTERIC Left 04/26/2020   Procedure: INTRAMEDULLARY (IM) NAIL INTERTROCHANTRIC;  Surgeon: Erle Crocker, MD;  Location: [place];  Service: Orthopedics;  Laterality: Left;   KNEE ARTHROSCOPY       Inpatient Medications: Scheduled Meds:  docusate sodium  100 mg Oral BID   enoxaparin (LOVENOX) injection  40 mg Subcutaneous Q24H   insulin aspart  0-9 Units Subcutaneous TID WC   midodrine  5 mg Oral TID WC   mometasone-formoterol  2 puff Inhalation BID   polyethylene glycol  17 g Oral Daily   polyvinyl alcohol  1 drop Both Eyes Daily   sodium chloride flush  3 mL Intravenous Q12H   Continuous Infusions:  PRN Meds: acetaminophen **  OR** acetaminophen, albuterol, HYDROmorphone (DILAUDID) injection, menthol-cetylpyridinium **OR** phenol, ondansetron **OR** ondansetron (ZOFRAN) IV, polyethylene glycol  Allergies:    Allergies  Allergen Reactions   Quinolones     Patient was warned about not using Cipro and similar antibiotics. Recent studies have raised concern that fluoroquinolone antibiotics could be associated with an increased risk of aortic aneurysm Fluoroquinolones  have non-antimicrobial properties that might jeopardise the integrity of the extracellular matrix of the vascular wall In a  propensity score matched cohort study in [place], there was a 66% increased rate of aortic aneurysm or dissection associated with oral fluoroquinolone use, compared wit    Social History:   Social History   Socioeconomic History   Marital status: Widowed    Spouse name: Not on file   Number of children: Not on file   Years of education: Not on file   Highest education level: Not on file  Occupational History   Occupation: retired  Tobacco Use   Smoking status: Current Some Day Smoker    Packs/day: 1.00    Types: Cigarettes    Start date: 1973   Smokeless tobacco: Never Used  Scientific laboratory technician Use: Never used  Substance and Sexual Activity   Alcohol use: Not Currently   Drug use: Never   Sexual activity: Not Currently  Other Topics Concern   Not on file  Social History Narrative   Lives with son   Social Determinants of Health   Financial Resource Strain: Not on file  Food Insecurity: Not on file  Transportation Needs: Not on file  Physical Activity: Not on file  Stress: Not on file  Social Connections: Not on file  Intimate Partner Violence: Not on file    Family History:   Family History  Problem Relation Age of Onset   Heart disease Father    Stroke Father      ROS:  Please see the history of present illness.  All other ROS reviewed and negative.     Physical Exam/Data:   Vitals:   04/29/20 1449 04/29/20 2023 04/30/20 0500 04/30/20 0859  BP: 120/68 104/67 112/70 105/69  Pulse:  87 100 97  Resp: 18 15 17 17   Temp: 97.8 F (36.6 C) 98.9 F (37.2 C) 98.6 F (37 C) 98.1 F (36.7 C)  TempSrc: Oral   Oral  SpO2: 98% 94% 95% 93%  Weight:      Height:        Intake/Output Summary (Last 24 hours) at 04/30/2020 1220 Last data filed at 04/30/2020 0900 Gross per 24 hour  Intake 720 ml  Output 1400 ml  Net -680 ml    Last 3 Weights 04/25/2020 03/27/2020 09/18/2019  Weight (lbs) 150 lb 157 lb 9.6 oz 153 lb 13.6 oz  Weight (kg) 68.04 kg 71.487 kg 69.786 kg     Body mass index is 22.81 kg/m.  General:  Well nourished, well developed, in no acute distress HEENT: normal Lymph: no adenopathy Neck: no JVD Endocrine:  No thryomegaly Vascular: No carotid bruits; FA pulses 2+ bilaterally without bruits  Cardiac:  normal S1, S2; irregularly irregular tachycardic;+  murmur  Lungs:  clear to auscultation bilaterally, no wheezing, rhonchi or rales  Abd: soft, nontender, no hepatomegaly  Ext: no edema Musculoskeletal:  No deformities, BUE and BLE strength normal and equal Skin: warm and dry  Neuro:  CNs 2-12 intact, no focal abnormalities noted Psych:  Normal affect   EKG:  The EKG was personally reviewed and demonstrates:  Atrial fibrillation at 93 bpm  Telemetry:  Telemetry was personally reviewed and demonstrates: Atrial fibrillation at rate of 90 to 120 bpm, briefly at 180s this morning around 10:30 AM  Relevant CV Studies:  AS summarized above   CT angio of chest aorta and pelvis 03/27/2020 IMPRESSION: Aortic aneurysm disease, with the greatest diameter of ascending aorta estimated 5.3 cm, and infrarenal abdominal aortic aneurysm estimated 4.4 cm. Aortic aneurysm NOS (ICD10-I71.9).  Coronary artery disease and aortic atherosclerosis. Aortic Atherosclerosis (ICD10-I70.0).  Questionable early changes of cirrhosis with nodular contour of the liver and enlarged caudate lobe.   Laboratory Data:  Chemistry Recent Labs  Lab 04/26/20 0359 04/26/20 1541 04/27/20 0337 04/28/20 0105  NA 135  --  135 138  K 4.0  --  3.9 3.6  CL 104  --  103 106  CO2 22  --  23 23  GLUCOSE 120*  --  145* 106*  BUN 9  --  12 15  CREATININE 0.81 0.86 0.86 0.73  CALCIUM 9.3  --  9.0 8.9  GFRNONAA >60 >60 >60 >60  ANIONGAP 9  --  9 9    Hematology Recent Labs  Lab 04/28/20 0105 04/29/20 0357  04/30/20 0406  WBC 10.4 9.4 8.6  RBC 3.34* 3.54* 3.66*  HGB 11.1* 11.7* 11.7*  HCT 32.1* 34.0* 35.5*  MCV 96.1 96.0 97.0  MCH 33.2 33.1 32.0  MCHC 34.6 34.4 33.0  RDW 13.5 13.5 13.3  PLT 100* 121* 143*   Radiology/Studies:  DG C-Arm 1-60 Min  Result Date: 04/26/2020 CLINICAL DATA:  Hip fracture EXAM: DG C-ARM 1-60 MIN; LEFT FEMUR 2 VIEWS CONTRAST:  None. FLUOROSCOPY TIME:  Fluoroscopy Time:  1:11 Number of Acquired Spot Images: 7 COMPARISON:  CT hip, 04/25/2020 FINDINGS: Intraoperative fluoroscopic assistance is provided for nail and screw fixation of an inter trochanteric fracture of the left hip. IMPRESSION: Intraoperative fluoroscopic assistance is provided for nail and screw fixation of an intertrochanteric fracture of the left hip. Electronically Signed   By: Eddie Candle M.D.   On: 04/26/2020 14:31   DG FEMUR MIN 2 VIEWS LEFT  Result Date: 04/26/2020 CLINICAL DATA:  Hip fracture EXAM: DG C-ARM 1-60 MIN; LEFT FEMUR 2 VIEWS CONTRAST:  None. FLUOROSCOPY TIME:  Fluoroscopy Time:  1:11 Number of Acquired Spot Images: 7 COMPARISON:  CT hip, 04/25/2020 FINDINGS: Intraoperative fluoroscopic assistance is provided for nail and screw fixation of an inter trochanteric fracture of the left hip. IMPRESSION: Intraoperative fluoroscopic assistance is provided for nail and screw fixation of an intertrochanteric fracture of the left hip. Electronically Signed   By: Eddie Candle M.D.   On: 04/26/2020 14:31     Assessment and Plan:   1. Persistent atrial fibrillation with rapid ventricular rate Patient with chronic history of rate control atrial fibrillation on metoprolol tartrate 12.5 mg twice daily.  He is not on chronic anticoagulation secondary to hematuria.  His rate was controlled upon arrival to ER.  Yesterday he became tachycardic, likely due to discontinuation of beta-blocker and deconditioning.  This morning his heart rate was briefly elevated to 180s.  Currently heart rate stable at  90-110s.  He would not be a good candidate for any rate control agent secondary to ongoing orthostasis.  He is not a good candidate for antiarrhythmic as we cannot anticoagulate him.  Likely his heart rate will improve with time.  At baseline he is very active and asymptomatic for atrial fibrillation. Start digoxin 0.125mg  for rate control so he can  tolerate PT better. His EF was normal by echo in 2020.  2.  Orthostatic hypotension -Patient with chronic intermittent dizziness with prior reduction in metoprolol -His mechanical fall could be due to orthostasis.   -Continue Midodrine 5 mg 3 times a day -May try compression stocking/abdominal binder if ongoing issue  3.  Dilated ascending aorta with moderate AI -Followed by Dr. Servando Snare -Conservative management given age and other comorbidities -Patient is also not interested in surgery  4.  Infrarenal abdominal aortic aneurysm -4.4 cm by most recent CT November 2021 -Followed by vascular  Otherwise per primary team 46}  CHA2DS2-VASc Score = 4  This indicates a 4.8% annual risk of stroke. The patient's score is based upon: CHF History: No HTN History: Yes Diabetes History: No Stroke History: No Vascular Disease History: Yes Age Score: 2 Gender Score: 0   For questions or updates, please contact [place] Please consult [email or web address] for contact info under    Jarrett Soho, PA  04/30/2020 12:20 PM

## 2020-04-30 NOTE — Progress Notes (Signed)
Inpatient Rehabilitation-Admissions Coordinator   CIR consult received. Per CM note, pt has plans to DC to [facility] CIR as they have accepted him once medically ready. AC will follow at a distance in case that were to change.   Raechel Ache, OTR/L  Rehab Admissions Coordinator  [phone] 04/30/2020 3:13 PM

## 2020-04-30 NOTE — TOC Progression Note (Addendum)
Transition of Care [facility]) - Progression Note    Patient Details  Name: Phillip West MRN: [id number] Date of Birth: 1929-05-23  Transition of Care [facility]) CM/SW [place], RN Phone Number: 04/30/2020, 1:10 PM  Clinical Narrative:    Case management spoke with Duffy Rhody, CM at [facility] In [facility] and they have an available bed for the patient for admission.  I spoke with the patient and son and they are both agreeable for admission to the facility once he is medically clear for discharge.  The patient was seen by cardiology today and I will continue to follow him for transitions of care needs and will make discharge plans to CIR in [facility] once the patient is stable for discharge.  I spoke with the patient and left a message of the son's voicemail that the patient would not be transferring to CIR at [facility] today.  Will continue to follow the patient for discharge plans.   Expected Discharge Plan: [place] Services Barriers to Discharge: Other (comment),Insurance Authorization  Expected Discharge Plan and Services Expected Discharge Plan: [place]   Discharge Planning Services: CM Consult Post Acute Care Choice: Durable Medical Equipment,Home Health,IP Rehab Living arrangements for the past 2 months: Single Family Home                                       Social Determinants of Health (SDOH) Interventions    Readmission Risk Interventions Readmission Risk Prevention Plan 04/28/2020  Post Dischage Appt Complete  Medication Screening Complete  Transportation Screening Complete  Some recent data might be hidden

## 2020-04-30 NOTE — Progress Notes (Signed)
Occupational Therapy Treatment Patient Details Name: Phillip West MRN: [id number] DOB: 1930/02/29 Today's Date: 04/30/2020    History of present illness 86 y.o male presenting S/p mechanical fall resulting in L femur fracture. Pt now s/p L IM nail 12/11. PMH includes abdominal aortic aneurysms, COPD, A. Fib not on AC, T2DM   OT comments  Pt progressing well towards goals, able to demonstrate bed mobility at Supervision level (determined to do this without assist). Session limited by tachycardia today with RN present and monitoring with mobility to bathroom. HR up to 195bpm during mobility, but pt overall asymptomatic. BP readings soft but stable throughout with mild dizziness passing with activity (see readings below). Due to HR, guided pt in seated ADLs today for safety. Updating recommendations for CIR consult as pt motivated to return to complete independence including ADLs, IADLs and mobility. Plan to further address LB ADLs and endurance as appropriate.    Follow Up Recommendations  CIR;Supervision/Assistance - 24 hour    Equipment Recommendations  3 in 1 bedside commode    Recommendations for Other Services Rehab consult    Precautions / Restrictions Precautions Precautions: Fall Precaution Comments: monitor orthostatics, very tachy Restrictions Weight Bearing Restrictions: Yes LLE Weight Bearing: Weight bearing as tolerated       Mobility Bed Mobility Overal bed mobility: Needs Assistance Bed Mobility: Supine to Sit;Sit to Supine     Supine to sit: Supervision;HOB elevated Sit to supine: Supervision   General bed mobility comments: Supervision with increased time/effort. Heavy use of bed rails and difficulty negotiating L LE back into bed  Transfers Overall transfer level: Needs assistance Equipment used: Rolling walker (2 wheeled) Transfers: Sit to/from Stand Sit to Stand: Min guard         General transfer comment: min guard for safety in power up, cues  for hand placement and sequencing needed with fair carryover. Pt able to mobilize to/from bathroom (with RN present due to HR) at min guard    Balance Overall balance assessment: Needs assistance Sitting-balance support: Feet supported Sitting balance-Leahy Scale: Good     Standing balance support: Bilateral upper extremity supported;During functional activity Standing balance-Leahy Scale: Poor Standing balance comment: reliant on UE support                           ADL either performed or assessed with clinical judgement   ADL Overall ADL's : Needs assistance/impaired     Grooming: Set up;Sitting;Brushing hair;Wash/dry face Grooming Details (indicate cue type and reason): Setup at sink to perform grooming tasks. limited standing during session due to increasing HR                             Functional mobility during ADLs: Min guard;Rolling walker;Cueing for sequencing General ADL Comments: Session limited by high HR with minimal activity. Mild dizziness that subsided with increasing activity     Vision   Vision Assessment?: No apparent visual deficits   Perception     Praxis      Cognition Arousal/Alertness: Awake/alert Behavior During Therapy: WFL for tasks assessed/performed Overall Cognitive Status: Within Functional Limits for tasks assessed                                          Exercises     Shoulder Instructions  General Comments Assessed orthostatic BP with low but stable readings (96/67 in bed, 92/56 sitting EOB, 90/44 standing at bedside). HR at rest in bed varied from 105-133bpm. With activity, HR noted to be up to 195bpm per telemetry monitor. RN present and monitoring during session. Pt reports mild dizziness and demo 1/4 DOE, otherwise asymptomatic.    Pertinent Vitals/ Pain       Pain Assessment: Faces Faces Pain Scale: Hurts a little bit Pain Location: L hip getting in to EOB, subsided relatively  quickly Pain Descriptors / Indicators: Aching;Grimacing Pain Intervention(s): Monitored during session;Repositioned  Home Living                                          Prior Functioning/Environment              Frequency  Min 2X/week        Progress Toward Goals  OT Goals(current goals can now be found in the care plan section)  Progress towards OT goals: Progressing toward goals  Acute Rehab OT Goals Patient Stated Goal: Hopes to get home soon OT Goal Formulation: With patient Time For Goal Achievement: 05/11/20 Potential to Achieve Goals: Good ADL Goals Pt Will Perform Grooming: with supervision;standing Pt Will Perform Lower Body Bathing: with min assist;sit to/from stand;sitting/lateral leans Pt Will Perform Lower Body Dressing: with min assist;sitting/lateral leans;sit to/from stand Pt Will Transfer to Toilet: with min assist;ambulating;regular height toilet;grab bars Additional ADL Goal #1: Pt will recall at least 3 fall prevention strategies to apply to ADL environment  Plan Discharge plan needs to be updated    Co-evaluation                 AM-PAC OT "6 Clicks" Daily Activity     Outcome Measure   Help from another person eating meals?: None Help from another person taking care of personal grooming?: A Little Help from another person toileting, which includes using toliet, bedpan, or urinal?: A Lot Help from another person bathing (including washing, rinsing, drying)?: A Lot Help from another person to put on and taking off regular upper body clothing?: A Little Help from another person to put on and taking off regular lower body clothing?: A Lot 6 Click Score: 16    End of Session Equipment Utilized During Treatment: Gait belt;Rolling walker  OT Visit Diagnosis: Unsteadiness on feet (R26.81);Other abnormalities of gait and mobility (R26.89);Muscle weakness (generalized) (M62.81);Pain Pain - Right/Left: Left Pain - part of  body: Leg   Activity Tolerance Treatment limited secondary to medical complications (Comment)   Patient Left in bed;with call bell/phone within reach;with bed alarm set   Nurse Communication Mobility status;Other (comment) (HR, BP readings)        Time: 1610-9604 OT Time Calculation (min): 46 min  Charges: OT General Charges $OT Visit: 1 Visit OT Treatments $Self Care/Home Management : 8-22 mins $Therapeutic Activity: 23-37 mins  Layla Maw, OTR/L   Layla Maw 04/30/2020, 11:27 AM

## 2020-04-30 NOTE — Progress Notes (Signed)
Physical Therapy Treatment Patient Details Name: Phillip West MRN: [id number] DOB: 11/09/1929 Today's Date: 04/30/2020    History of Present Illness 86 y.o male presenting S/p mechanical fall resulting in L femur fracture. Pt now s/p L IM nail 12/11. PMH includes abdominal aortic aneurysms, COPD, A. Fib not on AC, T2DM    PT Comments    Pt supine on arrival, agreeable to therapy session and with good participation and tolerance for mobility. Primary session focus on standing BLE AROM therapeutic exercises at bedside, safety with transfers and bed mobility. Pt Supervision for bed mobility, min guard for transfers and up to minA for balance with dynamic standing tasks. Pt monitored closely, reports subjective BLE weakness with increased time standing and still orthostatic, RN notified. Pt reports 7/10 modified RPE (fatigue) at end of session. Pt continues to benefit from skilled rehab in a moderate to high intensity post-acute setting to maximize functional gains before returning home. Orthostatic BPs  Sitting 118/63 (79) ; HR 100-110 bpm  Standing 83/60 (69) ; HR 110-150 bpm (during dynamic tasks, HR ~110-120 during BP assessment)     Follow Up Recommendations  CIR     Equipment Recommendations  Rolling walker with 5" wheels;3in1 (PT)    Recommendations for Other Services       Precautions / Restrictions Precautions Precautions: Fall Precaution Comments: monitor orthostatics, very tachy Restrictions Weight Bearing Restrictions: Yes LLE Weight Bearing: Weight bearing as tolerated    Mobility  Bed Mobility Overal bed mobility: Needs Assistance Bed Mobility: Supine to Sit;Sit to Supine     Supine to sit: Supervision;HOB elevated Sit to supine: Supervision;HOB elevated   General bed mobility comments: Supervision with increased time/effort. Heavy use of bed rails.  Transfers Overall transfer level: Needs assistance Equipment used: Rolling walker (2 wheeled) Transfers:  Sit to/from Stand Sit to Stand: Min guard         General transfer comment: min guard for safety in power up, cues for hand placement with poor carryover.  Ambulation/Gait Ambulation/Gait assistance: Min guard Gait Distance (Feet): 3 Feet Assistive device: Rolling walker (2 wheeled) Gait Pattern/deviations: Step-to pattern;Trunk flexed (downward gaze)     General Gait Details: deferred longer gait trial due to tachycardia during pre-gait/standing exercises at bedside   Stairs             Wheelchair Mobility    Modified Rankin (Stroke Patients Only)       Balance Overall balance assessment: Needs assistance Sitting-balance support: Feet supported Sitting balance-Leahy Scale: Good     Standing balance support: Bilateral upper extremity supported;During functional activity Standing balance-Leahy Scale: Poor Standing balance comment: reliant on UE support                            Cognition Arousal/Alertness: Awake/alert Behavior During Therapy: WFL for tasks assessed/performed Overall Cognitive Status: Within Functional Limits for tasks assessed                                 General Comments: frustrated with length of hospital stay but participatory and pleasant      Exercises Total Joint Exercises Marching in Standing: AROM;Strengthening;Both;10 reps;Standing (at RW) Other Exercises Other Exercises: Standing BLE AROM: heel raises, hamstring curls 1x10 reps ea; STS x 4    General Comments General comments (skin integrity, edema, etc.): orthostatic BP: seated 118/63 (79); standing 83/60 (69), pt denies dizziness  but reports fatigue      Pertinent Vitals/Pain Pain Assessment: 0-10 Pain Score: 5  (mobility) Faces Pain Scale: Hurts a little bit (resting) Pain Location: L hip with standing therex Pain Descriptors / Indicators: Aching;Grimacing Pain Intervention(s): Monitored during session;Repositioned   Vitals:   04/30/20  1443  BP: 125/81  Pulse: 99  Resp: 17  Temp: 97.8 F (36.6 C)  SpO2: 98%    Home Living                      Prior Function            PT Goals (current goals can now be found in the care plan section) Acute Rehab PT Goals Patient Stated Goal: Hopes to get home soon PT Goal Formulation: With patient Time For Goal Achievement: 05/11/20 Potential to Achieve Goals: Good Progress towards PT goals: Progressing toward goals (slowly; still tachy and with orthostatic hypotension)    Frequency    Min 6X/week      PT Plan Current plan remains appropriate    Co-evaluation              AM-PAC PT "6 Clicks" Mobility   Outcome Measure  Help needed turning from your back to your side while in a flat bed without using bedrails?: None Help needed moving from lying on your back to sitting on the side of a flat bed without using bedrails?: None Help needed moving to and from a bed to a chair (including a wheelchair)?: A Little Help needed standing up from a chair using your arms (e.g., wheelchair or bedside chair)?: A Little Help needed to walk in hospital room?: A Little Help needed climbing 3-5 steps with a railing? : Total 6 Click Score: 18    End of Session Equipment Utilized During Treatment: Gait belt Activity Tolerance: Treatment limited secondary to medical complications (Comment);Patient tolerated treatment well (tachycardia/orthostatic hypotension) Patient left: in bed;with bed alarm set;with call bell/phone within reach;with family/visitor present Nurse Communication: Mobility status;Other (comment) (BP drop) PT Visit Diagnosis: Unsteadiness on feet (R26.81);Other abnormalities of gait and mobility (R26.89);Muscle weakness (generalized) (M62.81);History of falling (Z91.81)     Time: 8295-6213 PT Time Calculation (min) (ACUTE ONLY): 31 min  Charges:  $Therapeutic Exercise: 8-22 mins $Therapeutic Activity: 8-22 mins                     Todrick Siedschlag P.,  PTA Acute Rehabilitation Services Pager: [phone] Office: [place] 04/30/2020, 4:29 PM

## 2020-05-01 DIAGNOSIS — S72002A Fracture of unspecified part of neck of left femur, initial encounter for closed fracture: Secondary | ICD-10-CM

## 2020-05-01 LAB — GLUCOSE, CAPILLARY
Glucose-Capillary: 112 mg/dL — ABNORMAL HIGH (ref 70–99)
Glucose-Capillary: 112 mg/dL — ABNORMAL HIGH (ref 70–99)
Glucose-Capillary: 112 mg/dL — ABNORMAL HIGH (ref 70–99)

## 2020-05-01 LAB — BASIC METABOLIC PANEL
Anion gap: 10 (ref 5–15)
BUN: 11 mg/dL (ref 8–23)
CO2: 24 mmol/L (ref 22–32)
Calcium: 9.4 mg/dL (ref 8.9–10.3)
Chloride: 103 mmol/L (ref 98–111)
Creatinine, Ser: 0.73 mg/dL (ref 0.61–1.24)
GFR, Estimated: >60 mL/min (ref >60)
Glucose, Bld: 131 mg/dL — ABNORMAL HIGH (ref 70–99)
Potassium: 3.8 mmol/L (ref 3.5–5.1)
Sodium: 137 mmol/L (ref 135–145)

## 2020-05-01 LAB — MAGNESIUM: Magnesium: 2 mg/dL (ref 1.7–2.4)

## 2020-05-01 MED ORDER — METOPROLOL TARTRATE 12.5 MG HALF TABLET
12.5000 mg | ORAL_TABLET | Freq: Two times a day (BID) | ORAL | Status: DC
  Administered 2020-05-01: 10:00:00 12.5 mg via ORAL
  Filled 2020-05-01: qty 1

## 2020-05-01 MED ORDER — MIDODRINE HCL 5 MG PO TABS: 5.0000 mg | ORAL_TABLET | Freq: Three times a day (TID) | ORAL | 0 refills | Status: AC

## 2020-05-01 MED ORDER — SENNOSIDES-DOCUSATE SODIUM 8.6-50 MG PO TABS
1.0000 | ORAL_TABLET | Freq: Two times a day (BID) | ORAL | Status: DC
  Administered 2020-05-01: 12:00:00 1 via ORAL
  Filled 2020-05-01: qty 1

## 2020-05-01 MED ORDER — DIGOXIN 125 MCG PO TABS: 0.1250 mg | ORAL_TABLET | Freq: Every day | ORAL | 0 refills | Status: DC

## 2020-05-01 MED ORDER — METOPROLOL TARTRATE 25 MG PO TABS: 12.5000 mg | ORAL_TABLET | Freq: Two times a day (BID) | ORAL | 0 refills | Status: DC

## 2020-05-01 NOTE — Progress Notes (Signed)
Pt is A&O x4. Left hip incision with staples dry and intact, dressing changed. Pain is controlled. Tried calling report to [facility] but the nurse is not avail. Left a call back number. Discharged pt via [facility]AR, discharge packet was given to [place].

## 2020-05-01 NOTE — Plan of Care (Signed)
Patient is s/p left IM nailing after fall at home. Dsgs to left lower extremity clean dry and intact with bruising noted. Patient's heart rate has remained stable between the upper 90s and low 100s. Has gotten up to 150 but only because he was trying to use the urinal in the bed. Asymptomatic. Cardiology note read. Otherwise no apparent distress or needs voiced. Discharge plan is for [facility] once medically cleared. Will continue to monitor and continue current POC.

## 2020-05-01 NOTE — Discharge Summary (Addendum)
Name: Phillip West MRN: [id number] DOB: 1929-07-16 86 y.o. PCP: Windy Fast, MD  Date of Admission: 04/25/2020  3:17 PM Date of Discharge: 05/01/2020 Attending Physician: Velna Ochs, MD  Discharge Diagnosis: 1. Left femur fracture s/p left intertrochanteric nail 2. Orthostatic hypotension 3. Atrial Fibrillation with RVR  Discharge Medications: Allergies as of 05/01/2020      Reactions   Quinolones    Patient was warned about not using Cipro and similar antibiotics. Recent studies have raised concern that fluoroquinolone antibiotics could be associated with an increased risk of aortic aneurysm Fluoroquinolones have non-antimicrobial properties that might jeopardise the integrity of the extracellular matrix of the vascular wall In a  propensity score matched cohort study in [place], there was a 66% increased rate of aortic aneurysm or dissection associated with oral fluoroquinolone use, compared wit      Medication List    STOP taking these medications   lisinopril 5 MG tablet Commonly known as: ZESTRIL     TAKE these medications   albuterol 108 (90 Base) MCG/ACT inhaler Commonly known as: VENTOLIN HFA Inhale 1 puff into the lungs every 6 (six) hours as needed for wheezing or shortness of breath.   aspirin EC 81 MG tablet Take 4 tablets (325 mg total) by mouth daily. What changed: how much to take   digoxin 0.125 MG tablet Commonly known as: LANOXIN Take 1 tablet (0.125 mg total) by mouth daily. Start taking on: May 02, 2020   Lubricating Plus Eye Drops 0.5 % Soln Generic drug: Carboxymethylcellulose Sod PF Place 1 drop into both eyes daily.   metoprolol tartrate 25 MG tablet Commonly known as: LOPRESSOR Take 0.5 tablets (12.5 mg total) by mouth 2 (two) times daily.   midodrine 5 MG tablet Commonly known as: PROAMATINE Take 1 tablet (5 mg total) by mouth 3 (three) times daily with meals.   pravastatin 40 MG tablet Commonly known as:  PRAVACHOL Take 1 tablet by mouth daily.   Symbicort 160-4.5 MCG/ACT inhaler Generic drug: budesonide-formoterol Inhale 2 puffs into the lungs 2 (two) times daily.   tamsulosin 0.4 MG Caps capsule Commonly known as: FLOMAX Take 0.4 mg by mouth daily.   Vitamin D 50 MCG (2000 UT) tablet Take 1 tablet by mouth daily.            Durable Medical Equipment  (From admission, onward)         Start     Ordered   04/27/20 2002  For home use only DME 3 n 1  Once        04/27/20 2002   04/27/20 2002  For home use only DME Walker rolling  Once       Question Answer Comment  Walker: With [place] Wheels   Patient needs a walker to treat with the following condition Femur fracture, left ([place])      04/27/20 2002          Disposition and follow-up:   Mr.Phillip West was discharged from [facility] in Stable condition.  At the hospital follow up visit please address:  1.  A).Left femur fracture s/p left intertrochanteric nail : Please see Dr. Lucia Gaskins for your next appointment in 2 weeks. B). Orthostatic hypotension: Please continue Midodrine TID and see your Cardiologist for follow up, if needed. C). Chronic Atrial Fibrillation with RVR: Please continue Digoxin and Metoprolol and see your Cardiologist for titration if needed.  2.  Labs / imaging needed at time of follow-up:  Please get Digoxin level on 05/03/2020.  3.  Pending labs/ test needing follow-up: None  Follow-up Appointments:  Follow-up Information    Erle Crocker, MD. Schedule an appointment as soon as possible for a visit in 2 weeks.   Specialty: Orthopedic Surgery Contact information: [place] [address] [phone]        Care, [facility] Follow up.   Specialty: Home Health Services Why: [facility] will be able to provide you with home health including PT/OT/ aide.  they will call you in the next 24-48 hours after you are discharged home to set up  services. Contact information: [place] [address] [phone]               Hospital Course by problem list: [name] is a 86 y/o male with a PMHx of thoracic and abdominal aortic aneurysms, COPD, A. Fib not on AC (due to chronic hematuria), T2DM who presented to the ED following a mechanical fall from a tractor, with contributory dizziness per patient.   Left femur fracture  Left hip x-ray showed an acute fracture of the proximal left femur distal neck with comminution into the intertrochanteric segment. Orthopedic surgery was consulted. ORIF with intertrochanteric nail was performed on 12/11. No complications post surgically. Patient able to weight bear with PT.   Orthostatic hypotension Patient with recorded orthostatic hypotension during PT session, endorsing issues with dizziness following starting Metoprolol for his atrial fibrillation--which has been titrated by his outpatient cardiologist. His metoprolol 12.5 mg BID was held for the remaining duration of the admission period; and he was started on Midodrine 5mg  TID with meals in light of sustained orthostatic hypotension during PT sessions. Cardiology consulted who recommended continuation of Midodrine with re-addition Metoprolol given need for rate control of A.fib.   Chronic Atrial fibrillation During course of stay, patient went into RVR after Metoprolol was held for orthostatic hypotension. Cardiology consulted and decision to start Digoxin was made. Patient was started on Digoxin 0.125 mg daily for rate control (a fixed dose), and his metoprolol 12.5 mg BID was restarted. ON discharge, patient had intermittent RVR with exertion that improved < 100 when at rest.   Discharge Vitals:   BP 124/69 (BP Location: Right Arm)    Pulse 97    Temp (!) 97.4 F (36.3 C) (Oral)    Resp 20    Ht 5\' 8"  (1.727 m)    Wt 68 kg    SpO2 96%    BMI 22.81 kg/m   Pertinent Labs, Studies, and Procedures:   CBC Latest Ref Rng & Units 04/30/2020 04/29/2020 04/28/2020  WBC 4.0 - 10.5 K/uL 8.6 9.4 10.4  Hemoglobin 13.0 - 17.0 g/dL 11.7(L) 11.7(L) 11.1(L)  Hematocrit 39.0 - 52.0 % 35.5(L) 34.0(L) 32.1(L)  Platelets 150 - 400 K/uL 143(L) 121(L) 100(L)   BMP Latest Ref Rng & Units 05/01/2020 04/28/2020 04/27/2020  Glucose 70 - 99 mg/dL 131(H) 106(H) 145(H)  BUN 8 - 23 mg/dL 11 15 12   Creatinine 0.61 - 1.24 mg/dL 0.73 0.73 0.86  Sodium 135 - 145 mmol/L 137 138 135  Potassium 3.5 - 5.1 mmol/L 3.8 3.6 3.9  Chloride 98 - 111 mmol/L 103 106 103  CO2 22 - 32 mmol/L 24 23 23   Calcium 8.9 - 10.3 mg/dL 9.4 8.9 9.0   X-Ray Hip:  IMPRESSION: Acute fracture of the proximal left femur distal neck, likely with comminution into the intertrochanteric segment. Moderate impaction.  Chest X-ray:  IMPRESSION: No acute cardiopulmonary abnormality.  CT Hip Left:  IMPRESSION: 1. Acute, impacted, and comminuted intertrochanteric left femoral fracture. 2. Partially visualized distal abdominal aorta aneurysm measuring at least up to 4.3 cm. Known finding better evaluated on CT angiography 03/27/2020. Recommend follow-up every 12 months and vascular consultation if not already in place/followed up by vascular . This recommendation follows ACR consensus guidelines: White Paper of the ACR Incidental Findings Committee II on Vascular Findings. J Am Coll Radiol 2013; 10:789-794. 3. Aortic aneurysm NOS (ICD10-I71.9). Aortic Atherosclerosis (ICD10-I70.0).  Discharge Instructions: Discharge Instructions    Call MD for:  extreme fatigue   Complete by: As directed    Call MD for:  persistant dizziness or light-headedness   Complete by: As directed    Call MD for:  redness, tenderness, or signs of infection (pain, swelling, redness, odor or green/yellow discharge around incision site)   Complete by: As directed    Call MD for:  severe uncontrolled pain   Complete by: As directed    Call MD for:  temperature  >100.4   Complete by: As directed    Diet - low sodium heart healthy   Complete by: As directed    Discharge instructions   Complete by: As directed    Mr Phillip West, Phillip West presented after a fall and after imaging you were found to have a Left Femur fracture. You had a surgery and a nail is placed into bone on 04/26/2020 and you tolerated it well.  Additionally you were found to have low blood pressure when you stand up. You worked with physical therapy and they notes same findings. For this situation, you were started on Midodrine 5 mg, three times daily with meals.   For your ongoing Atrial fibrillation, you are started on Digoxin 0.125 mg, one time daily.  Your blood pressure is better on digoxin and we are adding your Metoprolol 12.5 mg , two times daily and it can be titrated further for better control.  Please take these medications:  1. Midodrine 5 mg - three times daily with meals 2. Digoxin 0.125 mg- one time daily 3. Metoprolol 12.5 mg - two times daily  We are happy you feel better and plans to go for Inpatient rehabilitation. Take care!!   Increase activity slowly   Complete by: As directed    No wound care   Complete by: As directed        Signed: Honor Junes, MD 05/01/2020, 2:11 PM   Pager: [phone]

## 2020-05-01 NOTE — Hospital Course (Addendum)
[name] is a 86 y/o male with a PMHx of thoracic and abdominal aortic aneurysms, COPD, A. Fib not on AC (due to chronic hematuria), T2DM who presented to the ED following a mechanical fall from a tractor, with contributory dizziness per patient.   Left femur fracture  Left hip x-ray showed an acute fracture of the proximal left femur distal neck with comminution into the intertrochanteric segment. Orthopedic surgery was consulted, and follow up CT performed, following which an intertrochanteric nail was performed on 12/11. He has reported good pain control following.   Orthostatic hypotension Patient with recorded orthostatic hypotension during PT session, endorsing issues with dizziness following starting Metoprolol for his atrial fibrillation--which has been titrated by his outpatient cardiologist. His metoprolol 12.5 mg BID was held for the remaining duration of the admission period; and he was started on Midodrine 5mg , with dosage increased to 5 mg TID with meals in light of sustained orthostatic hypotension during PT sessions.   Atrial fibrillation During course of stay, patient went into RVR. As unable to tolerate calcium channel blockers or beta blockers in light of symptomatic orthostatic hypotension with activity, Cardiology consulted. Patient was started on Digoxin 0.125 mg daily for rate control (a fixed dose), and his metoprolol 12.5 mg BID was restarted.

## 2020-05-01 NOTE — Progress Notes (Signed)
Cardiology Progress Note  Patient ID: DEMIR West MRN: [id number] DOB: [birth year] Date of Encounter: 05/01/2020  Primary Cardiologist: [name], MD  Subjective   Chief Complaint: None.  Ready go home.  HPI: Heart rates from 90 bpm up to 140 bpm when he does activity.  Still not adequately rate controlled.  Metoprolol added today.  Blood pressure better.  ROS:  All other ROS reviewed and negative. Pertinent positives noted in the HPI.     Inpatient Medications  Scheduled Meds: . digoxin  0.125 mg Oral Daily  . docusate sodium  100 mg Oral BID  . enoxaparin (LOVENOX) injection  40 mg Subcutaneous Q24H  . insulin aspart  0-9 Units Subcutaneous TID WC  . metoprolol tartrate  12.5 mg Oral BID  . midodrine  5 mg Oral TID WC  . mometasone-formoterol  2 puff Inhalation BID  . polyethylene glycol  17 g Oral Daily  . polyvinyl alcohol  1 drop Both Eyes Daily  . sodium chloride flush  3 mL Intravenous Q12H   Continuous Infusions:  PRN Meds: acetaminophen **OR** acetaminophen, albuterol, HYDROmorphone (DILAUDID) injection, menthol-cetylpyridinium **OR** phenol, ondansetron **OR** ondansetron (ZOFRAN) IV, polyethylene glycol   Vital Signs   Vitals:   04/30/20 1926 04/30/20 2031 05/01/20 0300 05/01/20 0908  BP: 117/68  122/72 124/69  Pulse: 91  90 97  Resp: 20  17 20   Temp: 98.7 F (37.1 C)  98.3 F (36.8 C) (!) 97.4 F (36.3 C)  TempSrc: Oral  Oral Oral  SpO2: 96% 96% 97% 96%  Weight:      Height:        Intake/Output Summary (Last 24 hours) at 05/01/2020 0911 Last data filed at 05/01/2020 0900 Gross per 24 hour  Intake 240 ml  Output 1350 ml  Net -1110 ml   Last 3 Weights 04/25/2020 03/27/2020 09/18/2019  Weight (lbs) 150 lb 157 lb 9.6 oz 153 lb 13.6 oz  Weight (kg) 68.04 kg 71.487 kg 69.786 kg      Telemetry  Overnight telemetry shows atrial fibrillation with heart rates up to 140 bpm range, which I personally reviewed.   Physical Exam   Vitals:    04/30/20 1926 04/30/20 2031 05/01/20 0300 05/01/20 0908  BP: 117/68  122/72 124/69  Pulse: 91  90 97  Resp: 20  17 20   Temp: 98.7 F (37.1 C)  98.3 F (36.8 C) (!) 97.4 F (36.3 C)  TempSrc: Oral  Oral Oral  SpO2: 96% 96% 97% 96%  Weight:      Height:         Intake/Output Summary (Last 24 hours) at 05/01/2020 0911 Last data filed at 05/01/2020 0900 Gross per 24 hour  Intake 240 ml  Output 1350 ml  Net -1110 ml    Last 3 Weights 04/25/2020 03/27/2020 09/18/2019  Weight (lbs) 150 lb 157 lb 9.6 oz 153 lb 13.6 oz  Weight (kg) 68.04 kg 71.487 kg 69.786 kg    Body mass index is 22.81 kg/m.  General: Well nourished, well developed, in no acute distress Head: Atraumatic, normal size  Eyes: PEERLA, EOMI  Neck: Supple, no JVD Endocrine: No thryomegaly Cardiac: Normal S1, S2; irregular rhythm, no murmurs rubs or gallops Lungs: Clear to auscultation bilaterally, no wheezing, rhonchi or rales  Abd: Soft, nontender, no hepatomegaly  Ext: No edema, pulses 2+ Musculoskeletal: No deformities, BUE and BLE strength normal and equal Skin: Warm and dry, no rashes   Neuro: Alert and oriented to person, place, time, and  situation, CNII-XII grossly intact, no focal deficits  Psych: Normal mood and affect   Labs  High Sensitivity Troponin:  No results for input(s): TROPONINIHS in the last 720 hours.   Cardiac EnzymesNo results for input(s): TROPONINI in the last 168 hours. No results for input(s): TROPIPOC in the last 168 hours.  Chemistry Recent Labs  Lab 04/27/20 0337 04/28/20 0105 05/01/20 0353  NA 135 138 137  K 3.9 3.6 3.8  CL 103 106 103  CO2 23 23 24   GLUCOSE 145* 106* 131*  BUN 12 15 11   CREATININE 0.86 0.73 0.73  CALCIUM 9.0 8.9 9.4  GFRNONAA >60 >60 >60  ANIONGAP 9 9 10     Hematology Recent Labs  Lab 04/28/20 0105 04/29/20 0357 04/30/20 0406  WBC 10.4 9.4 8.6  RBC 3.34* 3.54* 3.66*  HGB 11.1* 11.7* 11.7*  HCT 32.1* 34.0* 35.5*  MCV 96.1 96.0 97.0  MCH 33.2 33.1  32.0  MCHC 34.6 34.4 33.0  RDW 13.5 13.5 13.3  PLT 100* 121* 143*   BNPNo results for input(s): BNP, PROBNP in the last 168 hours.  DDimer No results for input(s): DDIMER in the last 168 hours.   Radiology  No results found.  Cardiac Studies  Echo 04/30/2019 EF 60-65%, moderate left atrial dilation, ascending aortic aneurysm with mild aortic insufficiency  Patient Profile  Phillip West is a 86 y.o. male with a sending aortic aneurysm, abdominal aortic aneurysm, permanent atrial fibrillation (not on anticoagulation due to chronic hematuria), COPD, hypertension hyperlipidemia who was admitted on 04/25/2020 for fall and left femur fracture.  Cardiology was consulted on 04/30/2020 for A. fib with RVR.  Assessment & Plan   1.  Permanent atrial fibrillation with RVR -Rate slightly better on digoxin 0.125 mg daily.  BMP stable. -His blood pressure is much better on digoxin.  I will add back metoprolol tartrate 12.5 twice a day.  We can further titrate this depending on blood pressure for better rate control. -Not on anticoagulation due to chronic hematuria. -Hopefully his rates will continue to improve.  The last resort would be AV node ablation and pacemaker implantation.  I think this is a bit aggressive right now.  He has had an acute change given the femur fracture.   -He has been in Afib for years and cannot tolerate AC. Rhythm control not feasible.  -I think we should just allow for lenient rate control (110-120 bpm is ok). His HR will increase with exercise.  -We can titrate up the beta-blocker as BP will tolerate.  I would like for him to just go to rehab and get his strength back before we put him through other things that may not be necessary.  Continue with up titration of rate control agents for now.  His digoxin is a fixed dose.  2.  Ascending aortic aneurysm with moderate AI -Appears stable.  Not a candidate for surgery anyway.  Continue outpatient monitoring.  For  questions or updates, please contact [facility] Please consult [email or web address] for contact info under   Time Spent with Patient: I have spent a total of 25 minutes with patient reviewing hospital notes, telemetry, EKGs, labs and examining the patient as well as establishing an assessment and plan that was discussed with the patient.  > 50% of time was spent in direct patient care.    Signed, Addison Naegeli. Audie Box, [place]  05/01/2020 9:11 AM

## 2020-05-01 NOTE — Progress Notes (Signed)
Physical Therapy Treatment Patient Details Name: Phillip West MRN: [id number] DOB: 10-25-1929 Today's Date: 05/01/2020    History of Present Illness 86 y.o male presenting S/p mechanical fall resulting in L femur fracture. Pt now s/p L IM nail 12/11. PMH includes abdominal aortic aneurysms, COPD, A. Fib not on AC, T2DM    PT Comments    Pt up on [facility] on PTA arrival to room, agreeable to therapy session and with good participation and tolerance for mobility. Pt able to perform peri-care after toileting, but needs modA for standing balance at RW while performing self-care in stance. Pt performed multiple sit<>stands from EOB as well with minA progressing to min guard assist and needed minA for stability during short gait task back to EOB. Pt performed seated BLE therapeutic exercises with good tolerance as detailed below. Pt VSS during session, HR improved to low 110's after mobility. Pt will continue to benefit from skilled rehab in a moderate to high intensity post acute setting to maximize functional gains before returning home.   Follow Up Recommendations  CIR     Equipment Recommendations  Rolling walker with 5" wheels;3in1 (PT)    Recommendations for Other Services       Precautions / Restrictions Precautions Precautions: Fall Precaution Comments: monitor orthostatics, very tachy Restrictions Weight Bearing Restrictions: Yes LLE Weight Bearing: Weight bearing as tolerated    Mobility  Bed Mobility Overal bed mobility: Needs Assistance Bed Mobility: Sit to Supine       Sit to supine: Supervision;HOB elevated   General bed mobility comments: Supervision with increased time/effort. Heavy use of bed rails.  Transfers Overall transfer level: Needs assistance Equipment used: Rolling walker (2 wheeled) Transfers: Sit to/from [facility] Sit to Stand: Min assist;Min guard Stand pivot transfers: Min assist       General transfer comment: min guard to  minA for sit<>stand from BSC/EOB height to RW, pt needs increased time to perform and increased assist from [facility] height to RW; able to perform STS x5 reps from EOB with varying min guard to minA, limited carryover of cues for hand placement  Ambulation/Gait Ambulation/Gait assistance: Min guard Gait Distance (Feet): 6 Feet Assistive device: Rolling walker (2 wheeled) Gait Pattern/deviations: Step-to pattern;Trunk flexed Gait velocity: decreased   General Gait Details: deferred longer trial 2/2 pt fatigue and wanting to prepare for upcoming discharge   Stairs             Wheelchair Mobility    Modified Rankin (Stroke Patients Only)       Balance Overall balance assessment: Needs assistance Sitting-balance support: Feet supported Sitting balance-Leahy Scale: Good     Standing balance support: Bilateral upper extremity supported;During functional activity Standing balance-Leahy Scale: Poor Standing balance comment: reliant on UE support                            Cognition Arousal/Alertness: Awake/alert Behavior During Therapy: WFL for tasks assessed/performed Overall Cognitive Status: Within Functional Limits for tasks assessed Area of Impairment: Memory                     Memory: Decreased recall of precautions;Decreased short-term memory         General Comments: at times limited carryover of cues for sequencing transfers safely      Exercises Total Joint Exercises Ankle Circles/Pumps: AROM;Strengthening;Both;10 reps;Seated Long Arc Quad: AROM;Strengthening;Both;10 reps;Seated Other Exercises Other Exercises: Seated BLE AROM: hip flexion 1x10  reps ea; STS x 5 reps for strengthening    General Comments General comments (skin integrity, edema, etc.): HR 103-112 bpm seated EOB after stand pivot transfer and SpO2 96% on RA; pt denies dizziness and BP not further assessed      Pertinent Vitals/Pain Pain Assessment: Faces Faces Pain  Scale: Hurts little more Pain Location: L hip with mobility Pain Descriptors / Indicators: Grimacing;Guarding Pain Intervention(s): Monitored during session;Premedicated before session;Repositioned    Home Living                      Prior Function            PT Goals (current goals can now be found in the care plan section) Acute Rehab PT Goals Patient Stated Goal: Hopes to get home soon PT Goal Formulation: With patient Time For Goal Achievement: 05/11/20 Potential to Achieve Goals: Good Progress towards PT goals: Progressing toward goals    Frequency    Min 6X/week      PT Plan Current plan remains appropriate    Co-evaluation              AM-PAC PT "6 Clicks" Mobility   Outcome Measure  Help needed turning from your back to your side while in a flat bed without using bedrails?: None Help needed moving from lying on your back to sitting on the side of a flat bed without using bedrails?: A Little Help needed moving to and from a bed to a chair (including a wheelchair)?: A Little Help needed standing up from a chair using your arms (e.g., wheelchair or bedside chair)?: A Little Help needed to walk in hospital room?: A Little Help needed climbing 3-5 steps with a railing? : A Lot 6 Click Score: 18    End of Session Equipment Utilized During Treatment: Gait belt Activity Tolerance: Patient tolerated treatment well Patient left: in bed;with call bell/phone within reach;with bed alarm set Nurse Communication: Mobility status;Other (comment) PT Visit Diagnosis: Unsteadiness on feet (R26.81);Other abnormalities of gait and mobility (R26.89);Muscle weakness (generalized) (M62.81);History of falling (Z91.81)     Time: 8891-6945 PT Time Calculation (min) (ACUTE ONLY): 21 min  Charges:  $Therapeutic Exercise: 8-22 mins                     Noni Stonesifer P., PTA Acute Rehabilitation Services Pager: [phone] Office: [place] 05/01/2020, 2:45 PM

## 2020-05-01 NOTE — Progress Notes (Signed)
   Subjective: Patient reports doing well, pain remains well controlled. No concerns. He is awaiting discharge.   Objective:  Vital signs in last 24 hours: Vitals:   04/30/20 1926 04/30/20 2031 05/01/20 0300 05/01/20 0908  BP: 117/68  122/72 124/69  Pulse: 91  90 97  Resp: 20  17 20   Temp: 98.7 F (37.1 C)  98.3 F (36.8 C) (!) 97.4 F (36.3 C)  TempSrc: Oral  Oral Oral  SpO2: 96% 96% 97% 96%  Weight:      Height:       Weight change:   Intake/Output Summary (Last 24 hours) at 05/01/2020 1412 Last data filed at 05/01/2020 0900 Gross per 24 hour  Intake 240 ml  Output 1350 ml  Net -1110 ml    General: Well appearing, no acute distress. Lying in bed comfortably.  CV: Normal S1 and S2. Irregular rhythm Pulmonary: Normal work of breathing Skin: warm and dry Ext: No edema Psych: Normal mood and affect. Judgement intact.   Assessment/Plan: [name] is a 86 y/o male with a PMHx ofthoracic and abdominal aortic aneurysms, COPD, A. Fib, and T2DM who presents to the ED s/p mechanical fall currently admitted for left femur fracturePOD#5 from intertrochanteric nail.  Active Problems:   Atrial fibrillation, chronic (HCC)   Tobacco abuse   COPD (chronic obstructive pulmonary disease) (HCC)   Thoracic aortic aneurysm (TAA) (HCC)   Hypertension   Gross hematuria   Femur fracture, left (HCC)   Orthostatic hypotension  # Left Femur Fracture: - Orthopedic surgery;s/p left intertrochanteric nailon 12/11 - Dilaudid 0.5 q4h PRN for severe pain, still not requiring this - Lovenox for DVT ppx -[facility] CIR discharge  #Orthostatic Hypotension -ContinueMidodrine 5 mgTID with meals.   #ChronicA. Fib, rate controlled -Started digoxin 0.125 mg daily for rate control per Cardiology; appreciate recommendations.  -Restarted Metoprolol 12.5 mg BID per Cardiology rec -Digoxin level in 2 days  # T2DM - SSI (sensitive)  # COPD - Dulera BID  - Albuterol  PRN  # Gross hematuria # Hx of Renal Cysts Chronic problem. Followed with [facility]. Notes he has cystoscopy and CT bladder. Unable to see records.    LOS: 6 days   Azell Der, Medical Student 05/01/2020, 2:12 PM

## 2020-05-01 NOTE — TOC Transition Note (Addendum)
Transition of Care [facility]) - CM/SW Discharge Note   Patient Details  Name: SAGAN MASELLI MRN: [id number] Date of Birth: 31-Oct-1929  [facility] CM/SW Contact:  Curlene Labrum, RN Phone Number: 05/01/2020, 1:22 PM   Clinical Narrative:    Case management spoke with CIR at [facility] and they have an available bed for the patient today.  The Medicine Team agreed along with cardiology that the patient would be able to transfer to CI[facility] this afternoon.  I spoke with the patient and he is aware and agreeable to discharge to CIR today.  Once the discharge summary is completed - I will fax the updated clinicals to CIR at [facility], RN will be calling report to CIR at [facility] at [phone].  Transportation was arranged with [facility] for 2:30 today for transport to the facility.  05/01/2020- Discharge summary was faxed to [facility] rehab at [phone].  The patient's son was called and notified of the discharge to CIR today by [facility].   Final next level of care: [facility] (Home with [place] at [facility] in [place], [place]) Barriers to Discharge: Other (comment),Insurance Authorization   Patient Goals and CMS Choice Patient states their goals for this hospitalization and ongoing recovery are:: Patient plans to discharge to [facility] versus home with home health. CMS Medicare.gov Compare Post Acute Care list provided to:: Patient Choice offered to / list presented to : Patient  Discharge Placement                       Discharge Plan and Services   Discharge Planning Services: CM Consult Post Acute Care Choice: Durable Medical Equipment,Home Health,IP Rehab                               Social Determinants of Health (SDOH) Interventions     Readmission Risk Interventions Readmission Risk Prevention Plan 04/28/2020  Post Dischage Appt Complete  Medication Screening Complete   Transportation Screening Complete  Some recent data might be hidden

## 2021-03-13 ENCOUNTER — Other Ambulatory Visit: Payer: Self-pay | Admitting: *Deleted

## 2021-03-13 DIAGNOSIS — I714 Abdominal aortic aneurysm, without rupture, unspecified: Secondary | ICD-10-CM

## 2021-03-13 DIAGNOSIS — I739 Peripheral vascular disease, unspecified: Secondary | ICD-10-CM

## 2021-03-17 ENCOUNTER — Other Ambulatory Visit: Payer: Self-pay

## 2021-03-17 DIAGNOSIS — I739 Peripheral vascular disease, unspecified: Secondary | ICD-10-CM

## 2021-03-18 ENCOUNTER — Ambulatory Visit (INDEPENDENT_AMBULATORY_CARE_PROVIDER_SITE_OTHER)
Admission: RE | Admit: 2021-03-18 | Discharge: 2021-03-18 | Disposition: A | Discharge status: Home / Self Care | Payer: No Typology Code available for payment source | Source: Ambulatory Visit | Attending: Vascular Surgery | Admitting: Vascular Surgery

## 2021-03-18 ENCOUNTER — Other Ambulatory Visit: Payer: Self-pay

## 2021-03-18 ENCOUNTER — Ambulatory Visit (INDEPENDENT_AMBULATORY_CARE_PROVIDER_SITE_OTHER): Payer: Medicare Other | Admitting: Physician Assistant

## 2021-03-18 ENCOUNTER — Ambulatory Visit (HOSPITAL_COMMUNITY)
Admission: RE | Admit: 2021-03-18 | Discharge: 2021-03-18 | Disposition: A | Discharge status: Home / Self Care | Payer: No Typology Code available for payment source | Source: Ambulatory Visit | Attending: Vascular Surgery | Admitting: Vascular Surgery

## 2021-03-18 VITALS — BP 127/70 | HR 68 | Temp 96.7°F | Resp 20 | Ht 68.0 in | Wt 153.6 lb

## 2021-03-18 DIAGNOSIS — H9122 Sudden idiopathic hearing loss, left ear: Secondary | ICD-10-CM | POA: Insufficient documentation

## 2021-03-18 DIAGNOSIS — I714 Abdominal aortic aneurysm, without rupture, unspecified: Secondary | ICD-10-CM

## 2021-03-18 DIAGNOSIS — H903 Sensorineural hearing loss, bilateral: Secondary | ICD-10-CM | POA: Insufficient documentation

## 2021-03-18 DIAGNOSIS — R2689 Other abnormalities of gait and mobility: Secondary | ICD-10-CM | POA: Insufficient documentation

## 2021-03-18 DIAGNOSIS — Z0289 Encounter for other administrative examinations: Secondary | ICD-10-CM | POA: Insufficient documentation

## 2021-03-18 DIAGNOSIS — E119 Type 2 diabetes mellitus without complications: Secondary | ICD-10-CM | POA: Insufficient documentation

## 2021-03-18 DIAGNOSIS — I7143 Infrarenal abdominal aortic aneurysm, without rupture: Secondary | ICD-10-CM

## 2021-03-18 DIAGNOSIS — H6122 Impacted cerumen, left ear: Secondary | ICD-10-CM | POA: Insufficient documentation

## 2021-03-18 DIAGNOSIS — Z461 Encounter for fitting and adjustment of hearing aid: Secondary | ICD-10-CM | POA: Insufficient documentation

## 2021-03-18 DIAGNOSIS — I4811 Longstanding persistent atrial fibrillation: Secondary | ICD-10-CM | POA: Insufficient documentation

## 2021-03-18 DIAGNOSIS — Z961 Presence of intraocular lens: Secondary | ICD-10-CM | POA: Insufficient documentation

## 2021-03-18 DIAGNOSIS — Z8781 Personal history of (healed) traumatic fracture: Secondary | ICD-10-CM | POA: Insufficient documentation

## 2021-03-18 DIAGNOSIS — Z741 Need for assistance with personal care: Secondary | ICD-10-CM | POA: Insufficient documentation

## 2021-03-18 DIAGNOSIS — L603 Nail dystrophy: Secondary | ICD-10-CM | POA: Insufficient documentation

## 2021-03-18 DIAGNOSIS — D3131 Benign neoplasm of right choroid: Secondary | ICD-10-CM | POA: Insufficient documentation

## 2021-03-18 DIAGNOSIS — M25552 Pain in left hip: Secondary | ICD-10-CM | POA: Insufficient documentation

## 2021-03-18 DIAGNOSIS — I739 Peripheral vascular disease, unspecified: Secondary | ICD-10-CM

## 2021-03-18 DIAGNOSIS — R319 Hematuria, unspecified: Secondary | ICD-10-CM | POA: Insufficient documentation

## 2021-03-18 DIAGNOSIS — R55 Syncope and collapse: Secondary | ICD-10-CM | POA: Insufficient documentation

## 2021-03-18 DIAGNOSIS — H524 Presbyopia: Secondary | ICD-10-CM | POA: Insufficient documentation

## 2021-03-18 NOTE — Progress Notes (Signed)
Office Note     CC:  follow up Requesting Provider:  Windy Fast, MD  HPI: Phillip West is a 87 y.o. (1930/05/06) male who presents for follow-up of AAA. He was last seen by Dr. Donzetta Matters in December of 2020 and arrangements made for on year follow-up. He denies back or abdominal pain. He denies lower extremity claudication.  He fell 1 year ago and sustained left femur/hip fracture which required surgical repair.  He ambulates with a cane.  He is compliant with daily aspirin. He continues to smoke cigarettes. He is diabetic.  No medications. History of a fib.  Past Medical History:  Diagnosis Date   Abdominal aortic aneurysm (AAA) (HCC)    Atrial fibrillation ([place])    [place] right lower eyelid    COPD (chronic obstructive pulmonary disease) (HCC)    Diabetes mellitus ([place])    Gross hematuria    Hypertension    Syncope and collapse    Thoracic aortic aneurysm (TAA) ([place])     Past Surgical History:  Procedure Laterality Date   HERNIA REPAIR     INTRAMEDULLARY (IM) NAIL INTERTROCHANTERIC Left 04/26/2020   Procedure: INTRAMEDULLARY (IM) NAIL INTERTROCHANTRIC;  Surgeon: Erle Crocker, MD;  Location: [facility];  Service: Orthopedics;  Laterality: Left;   KNEE ARTHROSCOPY      Social History   Socioeconomic History   Marital status: Widowed    Spouse name: Not on file   Number of children: Not on file   Years of education: Not on file   Highest education level: Not on file  Occupational History   Occupation: retired  Tobacco Use   Smoking status: Some Days    Packs/day: 1.00    Types: Cigarettes    Start date: 1973   Smokeless tobacco: Never  Vaping Use   Vaping Use: Never used  Substance and Sexual Activity   Alcohol use: Not Currently   Drug use: Never   Sexual activity: Not Currently  Other Topics Concern   Not on file  Social History Narrative   Lives with son   Social Determinants of Health   Financial Resource Strain: Not on file  Food  Insecurity: Not on file  Transportation Needs: Not on file  Physical Activity: Not on file  Stress: Not on file  Social Connections: Not on file  Intimate Partner Violence: Not on file   Family History  Problem Relation Age of Onset   Heart disease Father    Stroke Father     Current Outpatient Medications  Medication Sig Dispense Refill   albuterol (VENTOLIN HFA) 108 (90 Base) MCG/ACT inhaler Inhale 1 puff into the lungs every 6 (six) hours as needed for wheezing or shortness of breath.     Cholecalciferol (VITAMIN D) 50 MCG (2000 UT) tablet Take 1 tablet by mouth daily.     digoxin (LANOXIN) 0.125 MG tablet Take 1 tablet (0.125 mg total) by mouth daily. 30 tablet 0   LUBRICATING PLUS EYE DROPS 0.5 % SOLN Place 1 drop into both eyes daily.     metoprolol tartrate (LOPRESSOR) 25 MG tablet Take 0.5 tablets (12.5 mg total) by mouth 2 (two) times daily. 30 tablet 0   midodrine (PROAMATINE) 5 MG tablet Take 1 tablet (5 mg total) by mouth 3 (three) times daily with meals. 90 tablet 0   pravastatin (PRAVACHOL) 40 MG tablet Take 1 tablet by mouth daily.     SYMBICORT 160-4.5 MCG/ACT inhaler Inhale 2 puffs into the lungs 2 (two)  times daily.     tamsulosin (FLOMAX) 0.4 MG CAPS capsule Take 0.4 mg by mouth daily.     No current facility-administered medications for this visit.    Allergies  Allergen Reactions   Quinolones     Patient was warned about not using Cipro and similar antibiotics. Recent studies have raised concern that fluoroquinolone antibiotics could be associated with an increased risk of aortic aneurysm Fluoroquinolones have non-antimicrobial properties that might jeopardise the integrity of the extracellular matrix of the vascular wall In a  propensity score matched cohort study in [place], there was a 66% increased rate of aortic aneurysm or dissection associated with oral fluoroquinolone use, compared wit   Ciprofloxacin     Other reaction(s): IRREGULAR HEART RATE      REVIEW OF SYSTEMS:   [X]  denotes positive finding, [ ]  denotes negative finding Cardiac  Comments:  Chest pain or chest pressure:    Shortness of breath upon exertion:    Short of breath when lying flat:    Irregular heart rhythm: x       Vascular    Pain in calf, thigh, or hip brought on by ambulation:    Pain in feet at night that wakes you up from your sleep:     Blood clot in your veins:    Leg swelling:         Pulmonary    Oxygen at home:    Productive cough:     Wheezing:         Neurologic    Sudden weakness in arms or legs:     Sudden numbness in arms or legs:     Sudden onset of difficulty speaking or slurred speech:    Temporary loss of vision in one eye:     Problems with dizziness:         Gastrointestinal    Blood in stool:     Vomited blood:         Genitourinary    Burning when urinating:     Blood in urine:        Psychiatric    Major depression:         Hematologic    Bleeding problems:    Problems with blood clotting too easily:        Skin    Rashes or ulcers:        Constitutional    Fever or chills:      PHYSICAL EXAMINATION:  Vitals:   03/18/21 0943  BP: 127/70  Pulse: 68  Resp: 20  Temp: (!) 96.7 F (35.9 C)  SpO2: 97%     General:  WDWN in NAD; vital signs documented above Gait: Ambulates with cane, no ataxia HENT: WNL, normocephalic Pulmonary: normal non-labored breathing , without Rales, rhonchi,  wheezing Cardiac: irregular rhythm, without  Murmurs without carotid bruit Abdomen: soft, NT, no masses Skin: without rashes Vascular Exam/Pulses: Extremities: without ischemic changes, without Gangrene , without cellulitis; without open wounds;  Musculoskeletal: no muscle wasting or atrophy  Neurologic: A&O X 3;  No focal weakness or paresthesias are detected Psychiatric:  The pt has Normal affect.   Non-Invasive Vascular Imaging:   03/18/2021 ABIs ABI/TBIToday's ABIToday's TBIPrevious ABIPrevious TBI   +-------+-----------+-----------+------------+------------+  Right  1.03       0.82                                 +-------+-----------+-----------+------------+------------+  Left   1.08       0.78                                 +-------+-----------+-----------+------------+------------+ Right TP = 108; triphasic waveforms Left TP = 103; triphasic waveforms  BLE arterial duplex: No visualized popliteal artery aneurysm bilaterally.   Aorta duplex: Summary:  Abdominal Aorta: There is evidence of abnormal dilatation of the mid and  distal Abdominal aorta. The largest aortic diameter remains essentially  unchanged compared to prior exam. Previous diameter measurement was 4.4 cm  obtained on 04/17/2019.     *See table(s) above for measurements and observations.      Preliminary    ASSESSMENT/PLAN:: 87 y.o. male here for follow up for AAA.  Essentially no change in size of maximal aortic diameter as compared to 2 years ago.  (4.4 cm >> 4.5 cm)  He is asymptomatic.  No evidence of popliteal artery aneurysm on arterial duplex.  ABIs are within normal limits. Continue statin and aspirin.  Discussed calling EMS should he develop significant abdominal back or abdominal pain.  Follow-up in 1 year with abdominal aortic aneurysm ultrasound.  Recommend smoking cessation, continued tight blood pressure and glucose control.  Barbie Banner, PA-C [facility] [phone]  Clinic MD:   Dr. Donzetta Matters

## 2021-04-13 ENCOUNTER — Ambulatory Visit: Payer: Medicare Other

## 2021-04-21 IMAGING — CT CT HIP*L* W/O CM
2 of 4 series · 15 of 46 positions shown, 17 images · non-contrast
Comparison: X-ray hip 04/25/2020

CLINICAL DATA: Status post fall, intertrochanteric versus femoral
neck fracture on x-ray

EXAM:
CT OF THE LEFT HIP WITHOUT CONTRAST
TECHNIQUE: Multidetector CT imaging of the left hip was performed according to
the standard protocol. Multiplanar CT image reconstructions were
also generated.

[Series 5: soft tissue · axial · 0.51mm/px · z∈[-925,-601]mm · 12 of 187 slices shown, 14 images]
[im 13/187  soft-tissue]
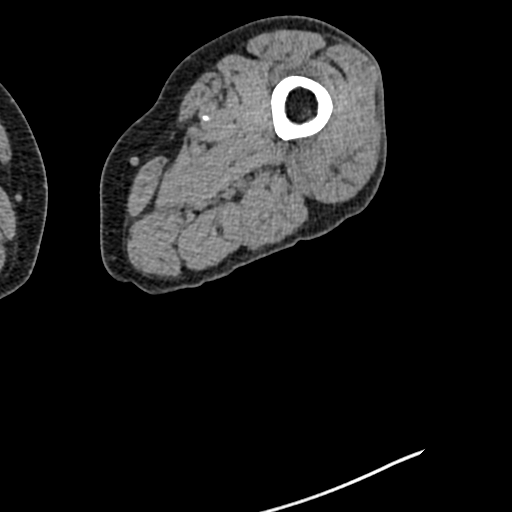
[im 13/187  bone]
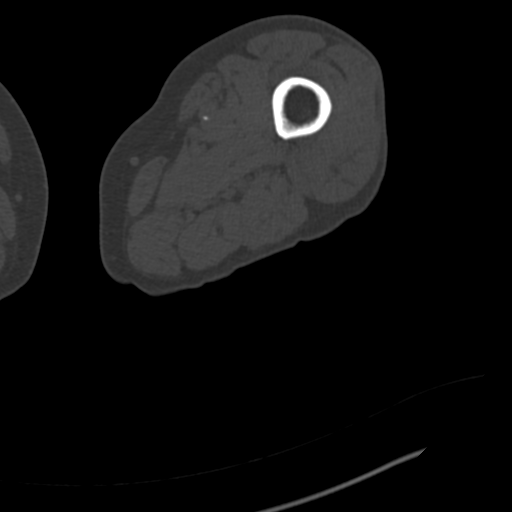
[im 25/187  soft-tissue]
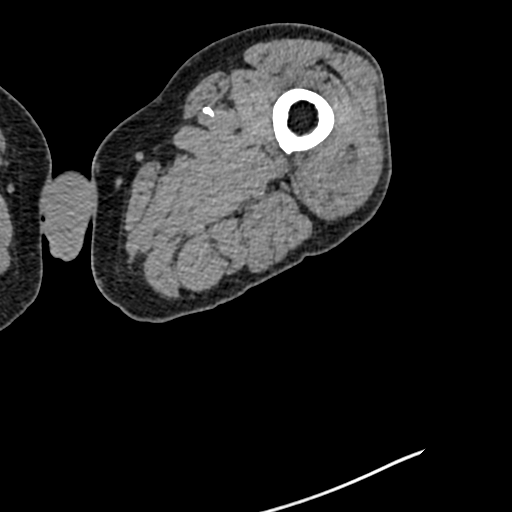
[im 43/187  soft-tissue]
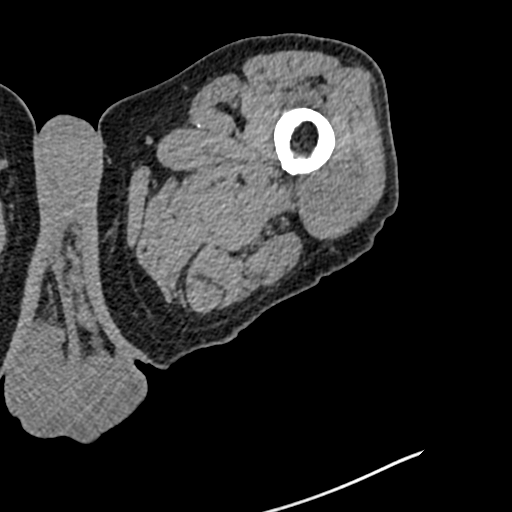
[im 55/187  soft-tissue]
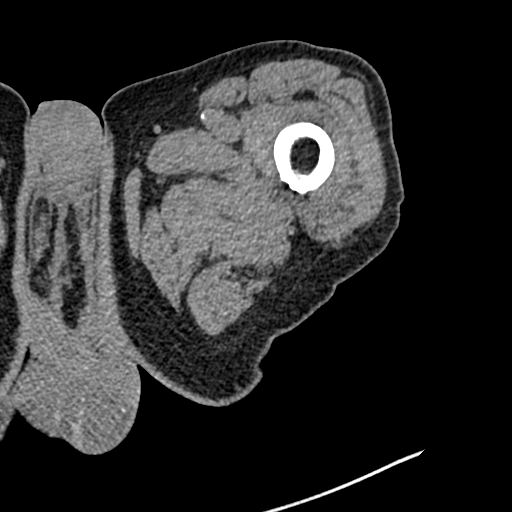
[im 73/187  soft-tissue]
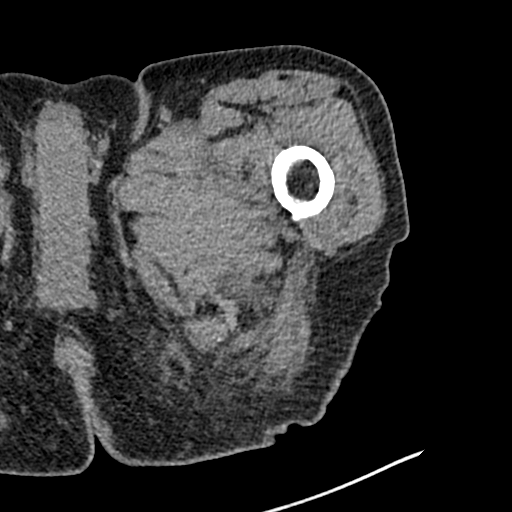
[im 85/187  soft-tissue]
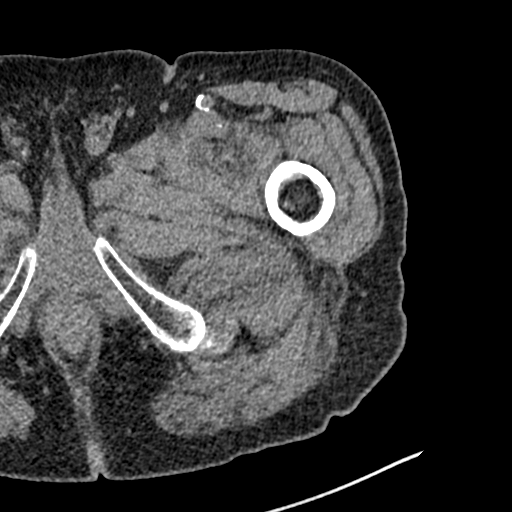
[im 103/187  soft-tissue]
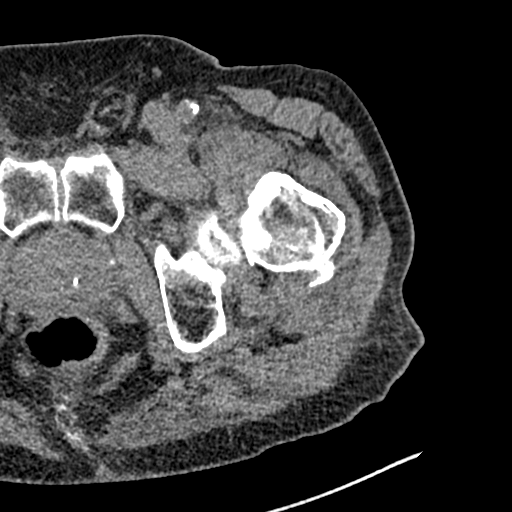
[im 115/187  soft-tissue]
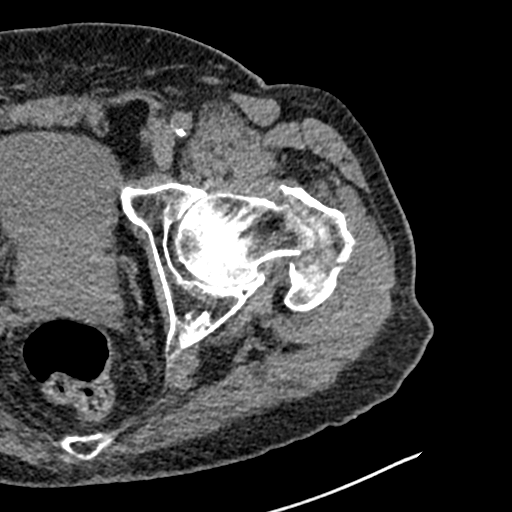
[im 133/187  soft-tissue]
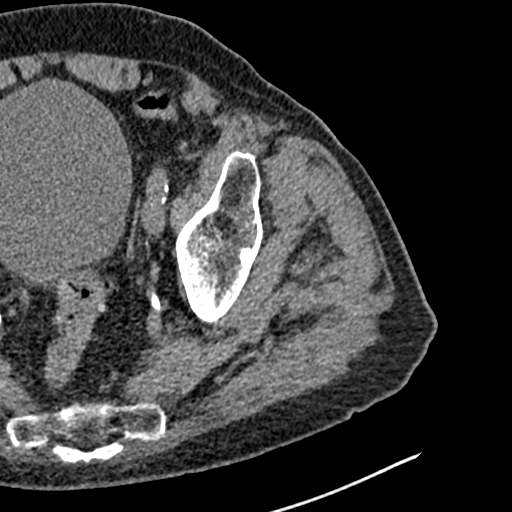
[im 133/187  bone]
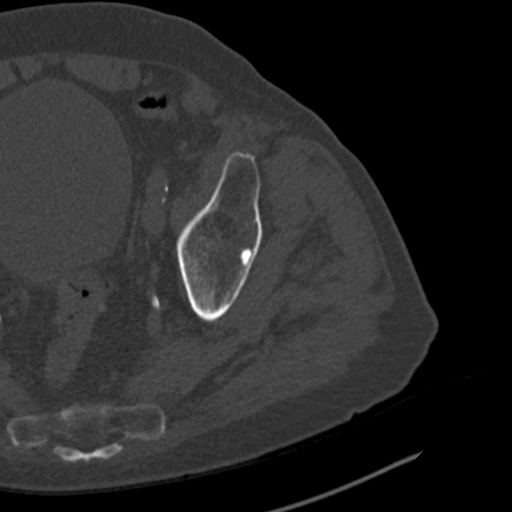
[im 145/187  soft-tissue]
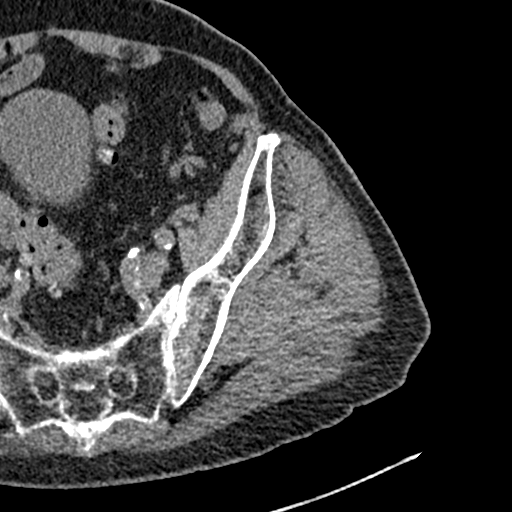
[im 163/187  soft-tissue]
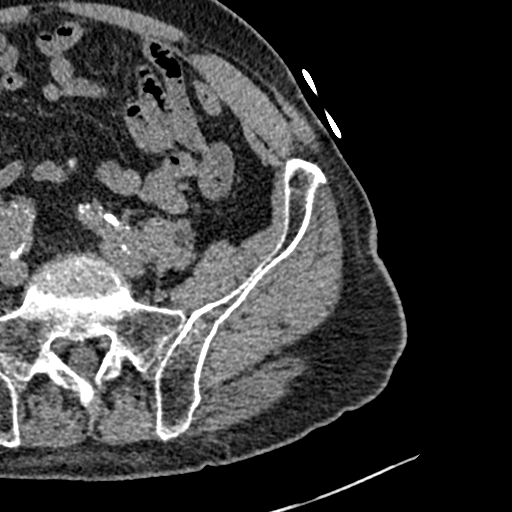
[im 175/187  soft-tissue]
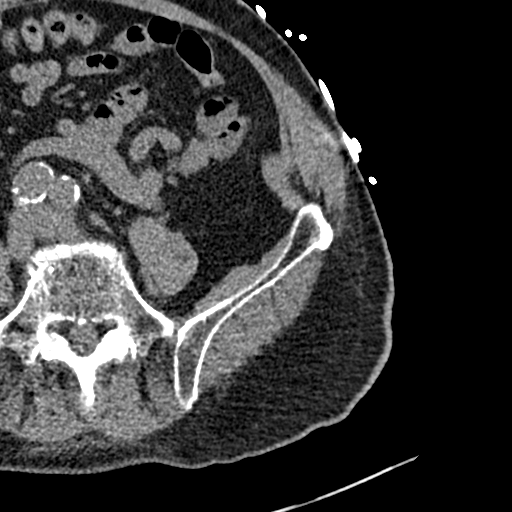

[Series 6: cor soft · coronal · 0.45mm/px · 3 of 143 slices shown]
[im 36/143  soft-tissue]
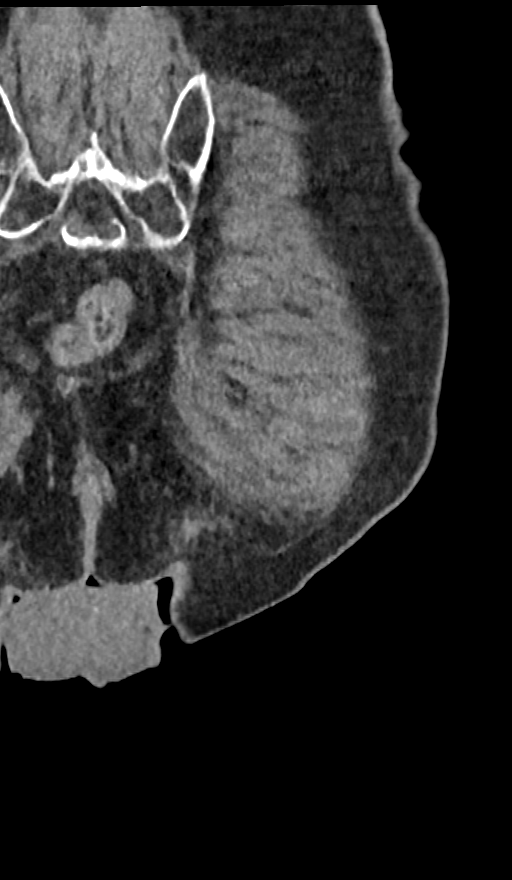
[im 72/143  soft-tissue]
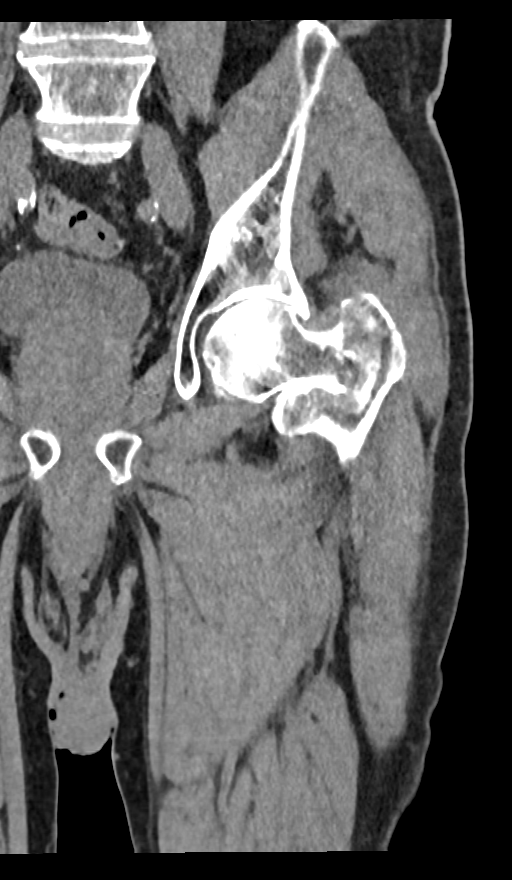
[im 107/143  soft-tissue]
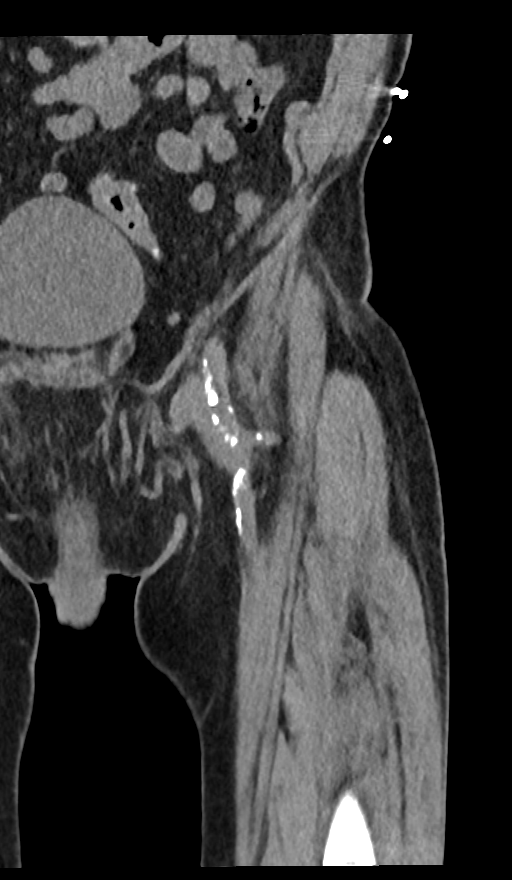

[15 of 46 positions shown; findings below may reference images not displayed]

FINDINGS: Bones/Joint/Cartilage

Redemonstration of an impacted and comminuted intertrochanteric
fracture of the left femur. No other acute fracture identified. No
left hip dislocation. No suspicious lytic or blastic osseous
lesions. Stable densely sclerotic lesion within the left iliac bone
likely represents a bone island. Stable left acetabular sclerotic
lesion likely represents an enchondroma. No cortical erosion or
destruction.

Ligaments

Suboptimally assessed by CT.

Muscles and Tendons

Unremarkable.

Soft tissues:

Mild subcutaneus soft tissue edema. No significant hematoma
formation. No organized fluid collection.

Visualized portions of the pelvis: Partially visualized known left
renal cyst. Colonic diverticulosis. Partially visualized distal
abdominal aorta aneurysm measuring at least up to 4.3 cm.
Atherosclerotic plaque.
IMPRESSION: 1. Acute, impacted, and comminuted intertrochanteric left femoral
fracture.
2. Partially visualized distal abdominal aorta aneurysm measuring at
least up to 4.3 cm. Known finding better evaluated on CT angiography
03/27/2020. Recommend follow-up every 12 months and vascular
consultation if not already in place/followed up by vascular . This
recommendation follows ACR consensus guidelines: White Paper of the
ACR Incidental Findings Committee II on Vascular Findings. [HOSPITAL] 0818; [DATE].
3. Aortic aneurysm NOS (JU7T5-2BI.Q). Aortic Atherosclerosis
(JU7T5-OTH.H).

## 2021-04-21 IMAGING — DX DG CHEST 1V
1 series · 1 of 1 positions shown · non-contrast
Comparison: CT a chest, Abdomen, and Pelvis 03/27/2020 and earlier.

CLINICAL DATA: 89-year-old male with acute left hip fracture status
post fall.

EXAM:
CHEST  1 VIEW

[chest ap]
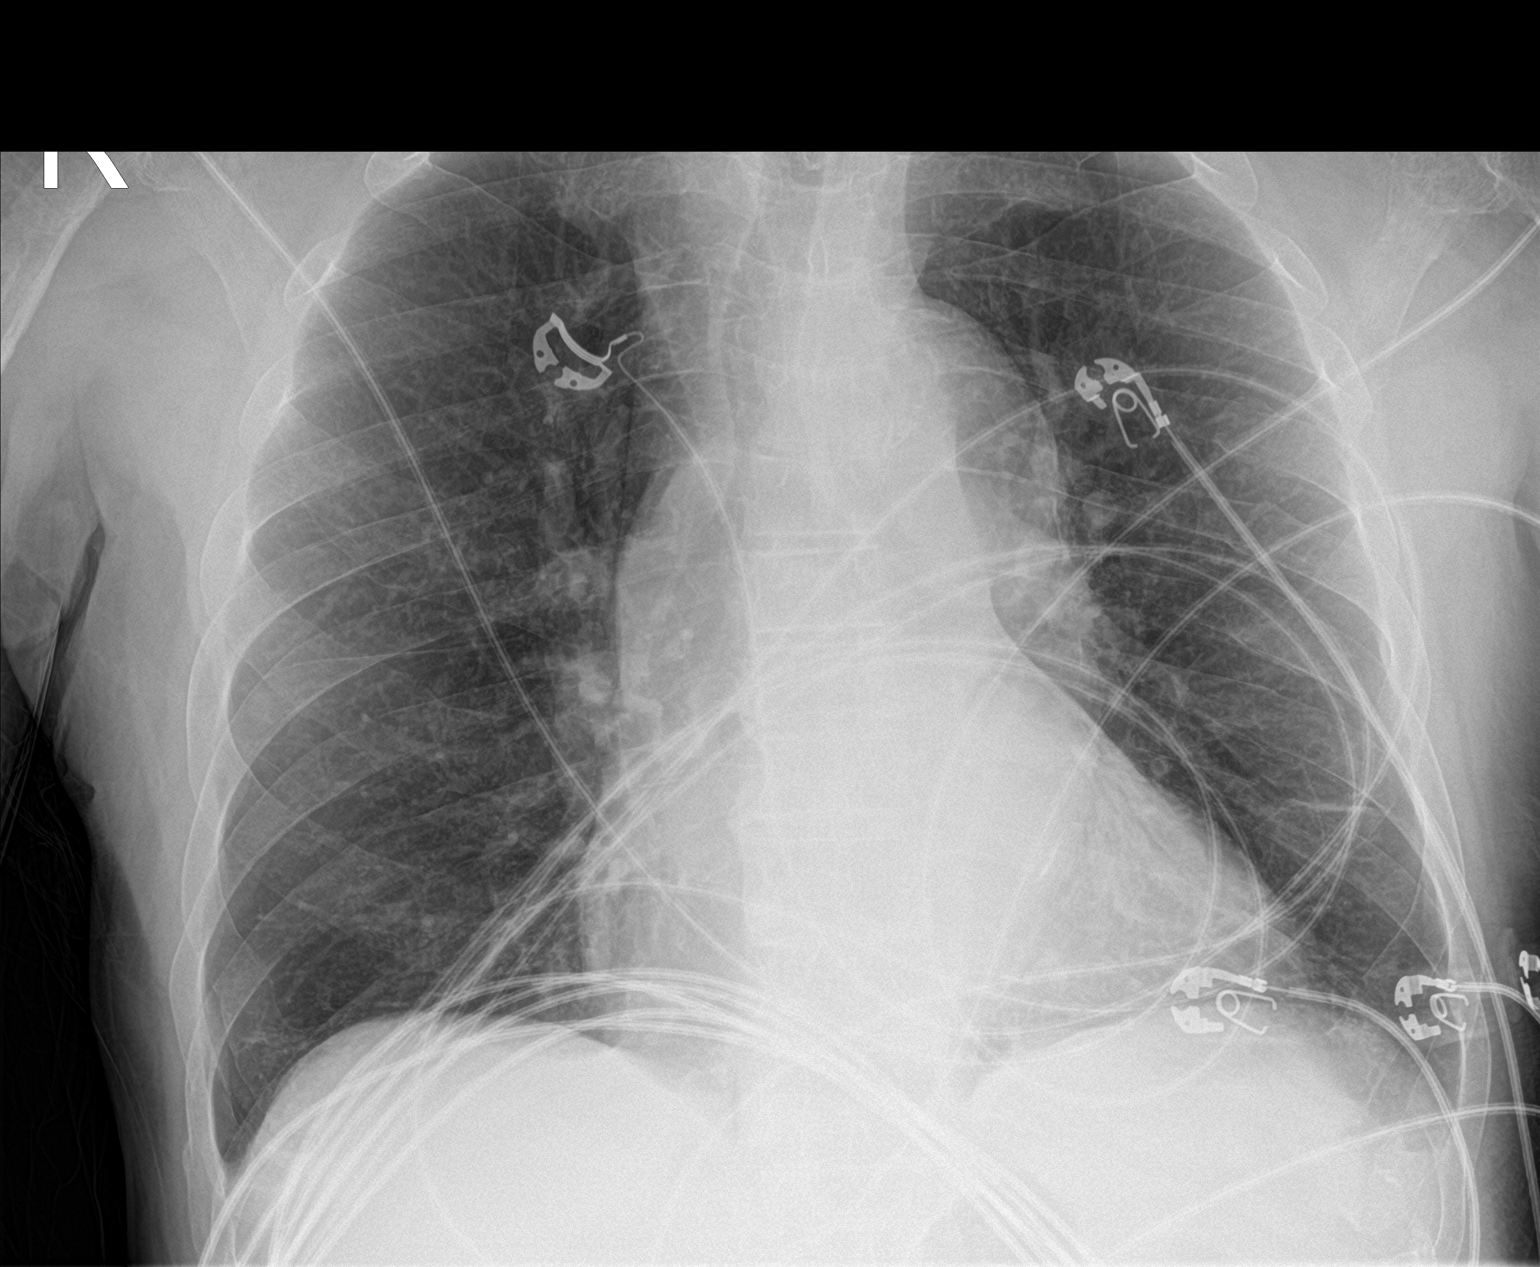

[1 of 1 positions shown; findings below may reference images not displayed]

FINDINGS: AP supine view at 3244 hours. Mild cardiomegaly and moderate
tortuosity of the thoracic aorta. Other mediastinal contours are
within normal limits. Visualized tracheal air column is within
normal limits. Allowing for portable technique the lungs are clear.
Multilevel chronic left lateral rib fractures beginning at the 5th
rib level appear stable.
IMPRESSION: No acute cardiopulmonary abnormality.

## 2021-04-22 IMAGING — RF DG FEMUR 2+V*L*
1 series · 6 of 6 positions shown · IV contrast (agent unspecified)
Comparison: CT hip, 04/25/2020

CLINICAL DATA: Hip fracture

EXAM:
DG C-ARM 1-60 MIN; LEFT FEMUR 2 VIEWS
CONTRAST:  None.
FLUOROSCOPY TIME:  Fluoroscopy Time:  [DATE]
Number of Acquired Spot Images: 7

[Series 1: run · 6 of 6 slices shown]
[im 1/6]
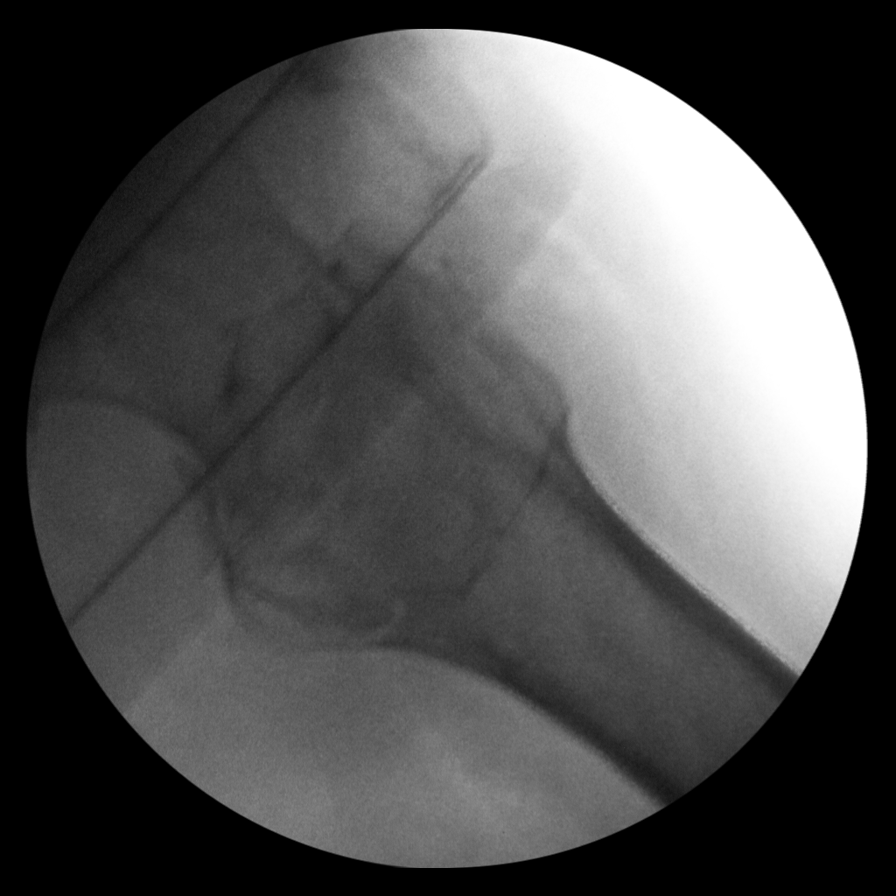
[im 2/6]
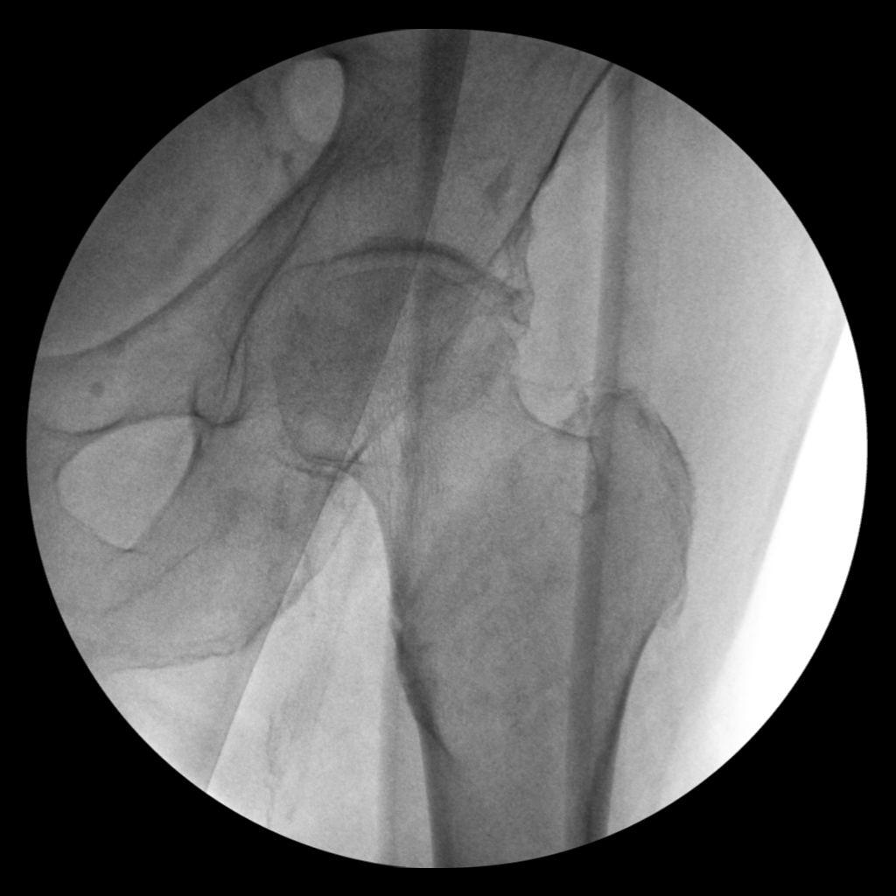
[im 3/6]
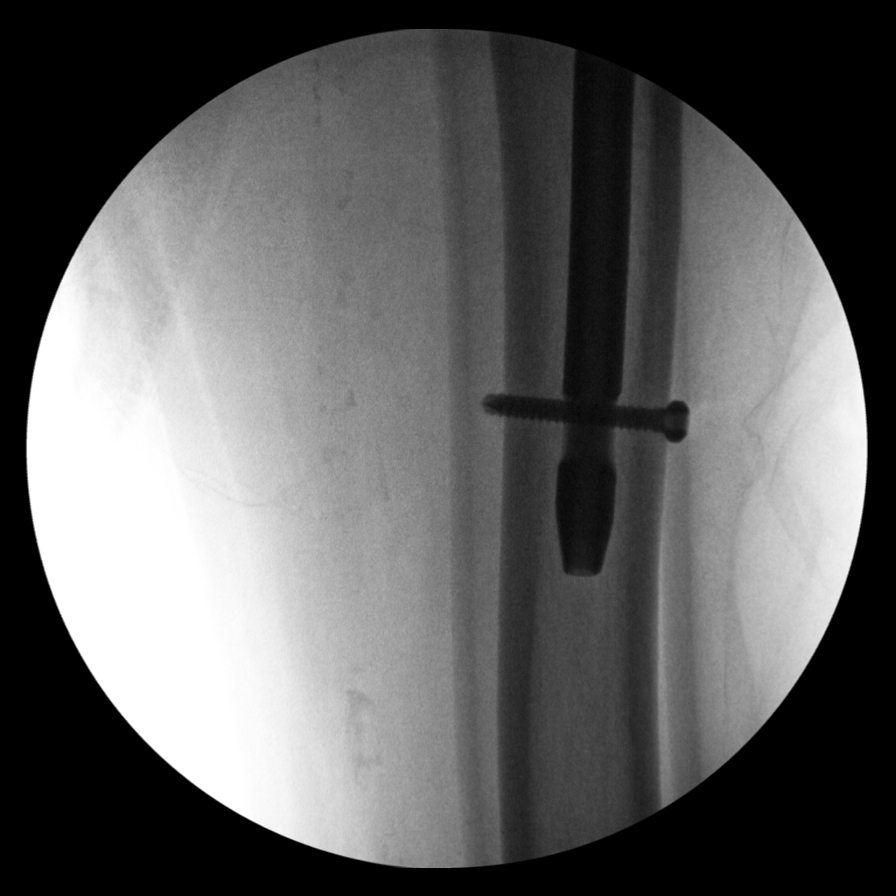
[im 4/6]
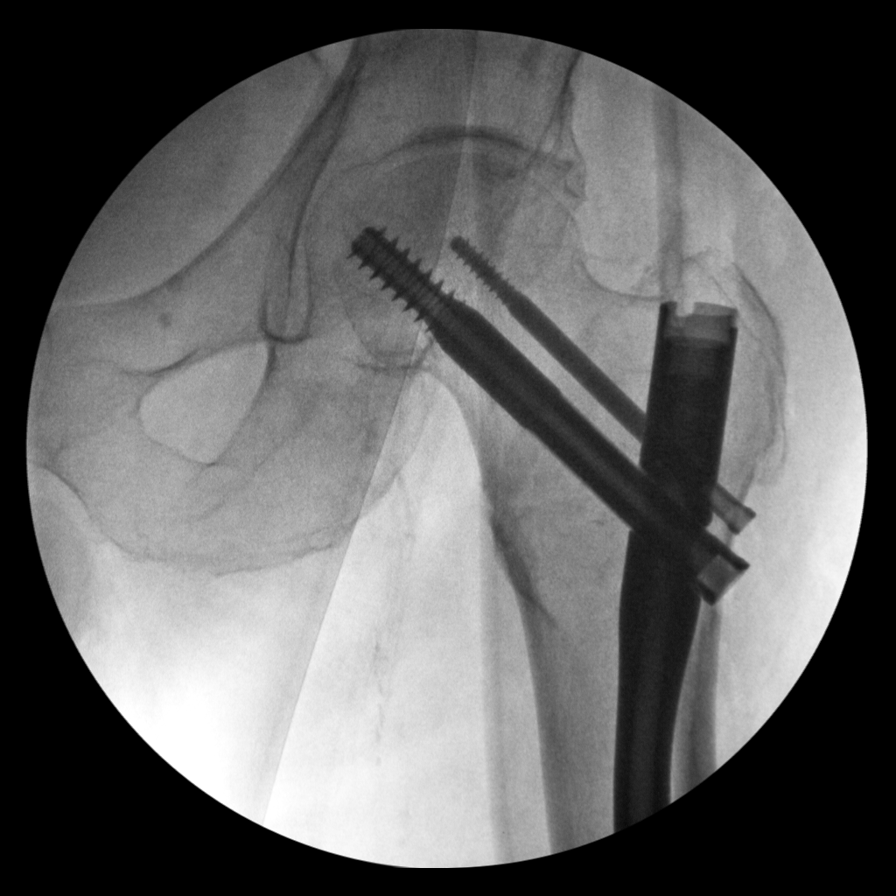
[im 5/6]
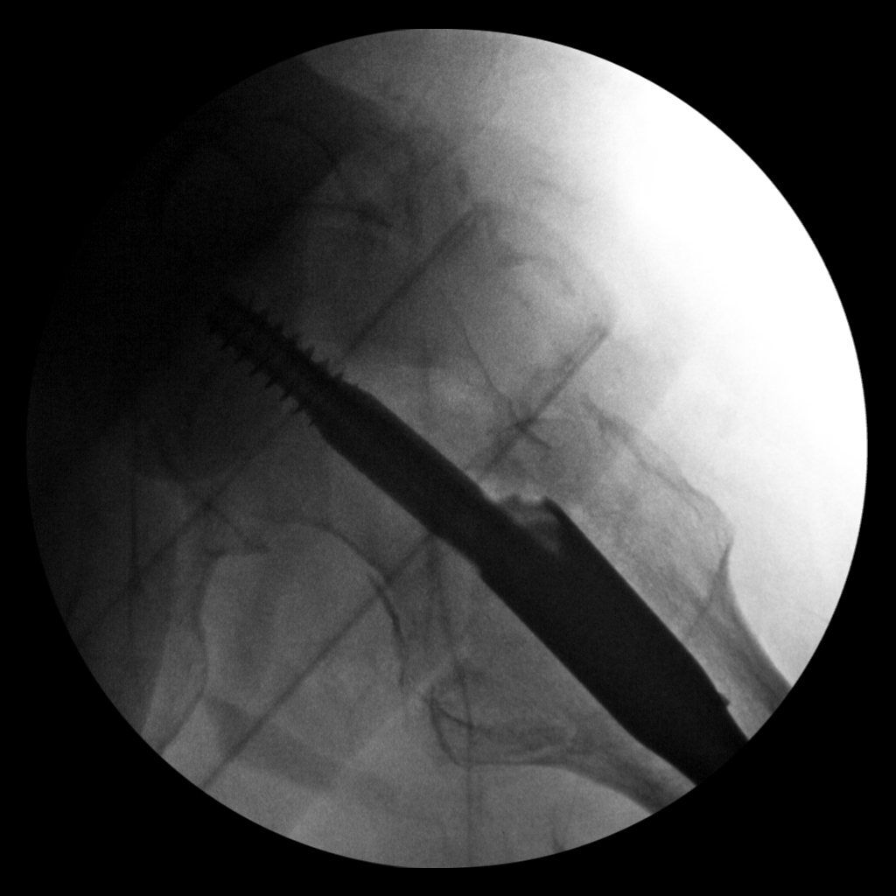
[im 6/6]
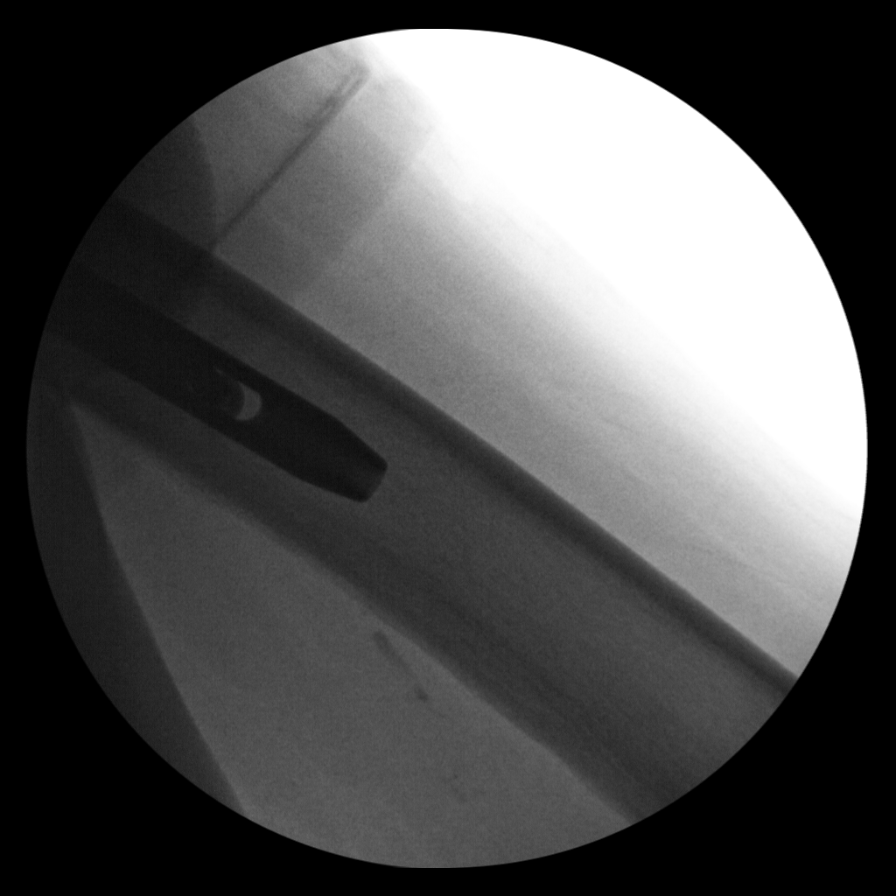

[6 of 6 positions shown; findings below may reference images not displayed]

FINDINGS: Intraoperative fluoroscopic assistance is provided for nail and
screw fixation of an inter trochanteric fracture of the left hip.
IMPRESSION: Intraoperative fluoroscopic assistance is provided for nail and
screw fixation of an intertrochanteric fracture of the left hip.

## 2021-04-23 ENCOUNTER — Other Ambulatory Visit: Payer: Self-pay | Admitting: Thoracic Surgery (Cardiothoracic Vascular Surgery)

## 2021-04-23 DIAGNOSIS — I712 Thoracic aortic aneurysm, without rupture, unspecified: Secondary | ICD-10-CM

## 2021-04-28 ENCOUNTER — Ambulatory Visit
Admission: RE | Admit: 2021-04-28 | Discharge: 2021-04-28 | Disposition: A | Discharge status: Home / Self Care | Payer: Medicare Other | Source: Ambulatory Visit | Attending: Thoracic Surgery (Cardiothoracic Vascular Surgery) | Admitting: Thoracic Surgery (Cardiothoracic Vascular Surgery)

## 2021-04-28 ENCOUNTER — Ambulatory Visit: Payer: Medicare Other

## 2021-04-28 DIAGNOSIS — I712 Thoracic aortic aneurysm, without rupture, unspecified: Secondary | ICD-10-CM

## 2021-04-28 MED ORDER — IOPAMIDOL (ISOVUE-300) INJECTION 61%
75.0000 mL | Freq: Once | INTRAVENOUS | Status: AC | PRN
  Administered 2021-04-28: 75 mL via INTRAVENOUS

## 2021-05-07 NOTE — Progress Notes (Signed)
°   °  [address]       [place] [address]             [phone]       Patient: Phillip West Provider: Office Consent for Telemedicine visit obtained.  Todays visit was completed via a real-time telehealth (see specific modality noted below). The patient/authorized person provided oral consent at the time of the visit to engage in a telemedicine encounter with the present provider at [facility]. The patient/authorized person was informed of the potential benefits, limitations, and risks of telemedicine. The patient/authorized person expressed understanding that the laws that protect confidentiality also apply to telemedicine. The patient/authorized person acknowledged understanding that telemedicine does not provide emergency services and that he or she would need to call 911 or proceed to the nearest hospital for help if such a need arose.   Total time spent in the clinical discussion 10 minutes.  Telehealth Modality: Phone visit (audio only)  I had a telephone visit with Phillip West   Diagnostic Studies & Laboratory data: CT chest from 04/28/2021: 1. Unchanged aneurysm of the tubular ascending thoracic aorta, measuring up to 5.1 x 5.1 cm. Unchanged enlargement of the proximal descending thoracic aorta measuring up to 3.7 x 3.7 cm. Ascending thoracic aortic aneurysm. Recommend semi-annual imaging followup by CTA or MRA and referral to cardiothoracic surgery if not already obtained and if clinically appropriate given patient age. This recommendation follows 2010 ACCF/AHA/AATS/ACR/ASA/SCA/SCAI/SIR/STS/SVM Guidelines for the Diagnosis and Management of Patients With Thoracic Aortic Disease. Circulation. 2010; 121: Q469-G295. Aortic aneurysm NOS (ICD10-I71.9) 2. Cardiomegaly and coronary artery disease. 3. Background of very fine centrilobular nodularity, most commonly seen in smoking-related respiratory bronchiolitis.    Assessment / Plan:   87 year old male previously  followed by Dr. Servando Snare for 5.2 cm ascending aortic aneurysm along with moderate aortic valve insufficiency.  I personally reviewed his cross-sectional imaging.  The ascending aortic aneurysm is essentially unchanged.  Given his age I do not recommend any plans for any future surgical interventions.  We discussed warning signs for dissection.  He is aware of weight restrictions tight blood pressure control.  He will follow-up as needed.   Lajuana Matte 05/07/2021 12:36 PM

## 2021-05-08 ENCOUNTER — Ambulatory Visit (INDEPENDENT_AMBULATORY_CARE_PROVIDER_SITE_OTHER): Payer: No Typology Code available for payment source | Admitting: Thoracic Surgery (Cardiothoracic Vascular Surgery)

## 2021-05-08 ENCOUNTER — Other Ambulatory Visit: Payer: Self-pay

## 2021-05-08 DIAGNOSIS — I7121 Aneurysm of the ascending aorta, without rupture: Secondary | ICD-10-CM

## 2021-11-02 ENCOUNTER — Encounter: Payer: Self-pay | Admitting: Internal Medicine

## 2021-12-04 ENCOUNTER — Ambulatory Visit (INDEPENDENT_AMBULATORY_CARE_PROVIDER_SITE_OTHER): Payer: No Typology Code available for payment source | Admitting: Thoracic Surgery (Cardiothoracic Vascular Surgery)

## 2021-12-04 ENCOUNTER — Ambulatory Visit: Payer: No Typology Code available for payment source | Admitting: Thoracic Surgery (Cardiothoracic Vascular Surgery)

## 2021-12-04 VITALS — BP 117/66 | HR 64 | Resp 20 | Ht 68.0 in | Wt 150.0 lb

## 2021-12-04 DIAGNOSIS — I7121 Aneurysm of the ascending aorta, without rupture: Secondary | ICD-10-CM

## 2021-12-04 NOTE — Progress Notes (Signed)
      [address]       [place] [address]             [id number]        Phillip West [place] Medical Record #[id number] Date of Birth: 1929/06/25  Referring: [name], MD Primary Care: Windy Fast, MD Primary Cardiologist:[name], MD  Reason for visit:   follow-up  History of Present Illness:     88 year old male presents at the request of his primary care physician for evaluation of 5.9 cm ascending aortic aneurysm.  He has a known history of this and at his last visit he stated that he would not want any open heart surgery.  Physical Exam: BP 117/66   Pulse 64   Resp 20   Ht '5\' 8"'$  (1.727 m)   Wt 150 lb (68 kg)   SpO2 97% Comment: RA  BMI 22.81 kg/m   Alert NAD Abdomen, ND No peripheral edema   Diagnostic Studies & Laboratory data: CT chest from the [place]: Stable 5.1 cm ascending aortic aneurysm.    Assessment / Plan:   88 year old male with significant ascending aortic aneurysm this has been stable over the past several studies.  That being said given his age and his functional status he stated that or any open heart surgery even in the case that he presents with a dissection.  We discussed the risk factors and warning signs for this.  In my opinion there is no need for any for his further surveillance.   Lajuana Matte 12/04/2021 1:22 PM

## 2022-02-16 ENCOUNTER — Encounter (HOSPITAL_COMMUNITY): Payer: Self-pay

## 2022-02-16 ENCOUNTER — Emergency Department (HOSPITAL_COMMUNITY): Payer: No Typology Code available for payment source | Admitting: Anesthesiology

## 2022-02-16 ENCOUNTER — Inpatient Hospital Stay (HOSPITAL_COMMUNITY)
Admission: EM | Admit: 2022-02-16 | Discharge: 2022-02-24 | DRG: 481 | Disposition: A | Discharge status: Skilled Nursing Facility | Payer: No Typology Code available for payment source | Attending: Internal Medicine | Admitting: Internal Medicine

## 2022-02-16 ENCOUNTER — Inpatient Hospital Stay (HOSPITAL_COMMUNITY): Payer: No Typology Code available for payment source | Admitting: Anesthesiology

## 2022-02-16 ENCOUNTER — Other Ambulatory Visit: Payer: Self-pay

## 2022-02-16 ENCOUNTER — Encounter (HOSPITAL_COMMUNITY)
Admission: EM | Disposition: A | Discharge status: Skilled Nursing Facility | Payer: Self-pay | Source: Home / Self Care | Attending: Internal Medicine

## 2022-02-16 ENCOUNTER — Emergency Department (HOSPITAL_COMMUNITY): Payer: No Typology Code available for payment source

## 2022-02-16 ENCOUNTER — Inpatient Hospital Stay (HOSPITAL_COMMUNITY): Payer: No Typology Code available for payment source

## 2022-02-16 DIAGNOSIS — Y92009 Unspecified place in unspecified non-institutional (private) residence as the place of occurrence of the external cause: Secondary | ICD-10-CM

## 2022-02-16 DIAGNOSIS — Z881 Allergy status to other antibiotic agents status: Secondary | ICD-10-CM

## 2022-02-16 DIAGNOSIS — R42 Dizziness and giddiness: Secondary | ICD-10-CM | POA: Diagnosis present

## 2022-02-16 DIAGNOSIS — I351 Nonrheumatic aortic (valve) insufficiency: Secondary | ICD-10-CM | POA: Diagnosis present

## 2022-02-16 DIAGNOSIS — E119 Type 2 diabetes mellitus without complications: Secondary | ICD-10-CM

## 2022-02-16 DIAGNOSIS — I712 Thoracic aortic aneurysm, without rupture, unspecified: Secondary | ICD-10-CM | POA: Diagnosis present

## 2022-02-16 DIAGNOSIS — Z8249 Family history of ischemic heart disease and other diseases of the circulatory system: Secondary | ICD-10-CM | POA: Diagnosis not present

## 2022-02-16 DIAGNOSIS — I4891 Unspecified atrial fibrillation: Secondary | ICD-10-CM | POA: Diagnosis not present

## 2022-02-16 DIAGNOSIS — Z823 Family history of stroke: Secondary | ICD-10-CM

## 2022-02-16 DIAGNOSIS — Z7951 Long term (current) use of inhaled steroids: Secondary | ICD-10-CM

## 2022-02-16 DIAGNOSIS — W1830XA Fall on same level, unspecified, initial encounter: Secondary | ICD-10-CM | POA: Diagnosis present

## 2022-02-16 DIAGNOSIS — R296 Repeated falls: Secondary | ICD-10-CM | POA: Diagnosis present

## 2022-02-16 DIAGNOSIS — I10 Essential (primary) hypertension: Secondary | ICD-10-CM | POA: Diagnosis present

## 2022-02-16 DIAGNOSIS — J449 Chronic obstructive pulmonary disease, unspecified: Secondary | ICD-10-CM | POA: Diagnosis present

## 2022-02-16 DIAGNOSIS — Y9301 Activity, walking, marching and hiking: Secondary | ICD-10-CM | POA: Diagnosis present

## 2022-02-16 DIAGNOSIS — W19XXXA Unspecified fall, initial encounter: Secondary | ICD-10-CM

## 2022-02-16 DIAGNOSIS — I4821 Permanent atrial fibrillation: Secondary | ICD-10-CM | POA: Diagnosis present

## 2022-02-16 DIAGNOSIS — D62 Acute posthemorrhagic anemia: Secondary | ICD-10-CM | POA: Diagnosis not present

## 2022-02-16 DIAGNOSIS — S72001A Fracture of unspecified part of neck of right femur, initial encounter for closed fracture: Principal | ICD-10-CM

## 2022-02-16 DIAGNOSIS — S72009A Fracture of unspecified part of neck of unspecified femur, initial encounter for closed fracture: Secondary | ICD-10-CM | POA: Diagnosis present

## 2022-02-16 DIAGNOSIS — R001 Bradycardia, unspecified: Secondary | ICD-10-CM | POA: Diagnosis not present

## 2022-02-16 DIAGNOSIS — R319 Hematuria, unspecified: Secondary | ICD-10-CM | POA: Diagnosis present

## 2022-02-16 DIAGNOSIS — R0789 Other chest pain: Secondary | ICD-10-CM | POA: Diagnosis present

## 2022-02-16 DIAGNOSIS — F17210 Nicotine dependence, cigarettes, uncomplicated: Secondary | ICD-10-CM | POA: Diagnosis present

## 2022-02-16 DIAGNOSIS — M80051A Age-related osteoporosis with current pathological fracture, right femur, initial encounter for fracture: Secondary | ICD-10-CM

## 2022-02-16 DIAGNOSIS — I714 Abdominal aortic aneurysm, without rupture, unspecified: Secondary | ICD-10-CM | POA: Diagnosis present

## 2022-02-16 DIAGNOSIS — Z79899 Other long term (current) drug therapy: Secondary | ICD-10-CM

## 2022-02-16 DIAGNOSIS — I951 Orthostatic hypotension: Secondary | ICD-10-CM | POA: Diagnosis present

## 2022-02-16 DIAGNOSIS — E785 Hyperlipidemia, unspecified: Secondary | ICD-10-CM | POA: Diagnosis present

## 2022-02-16 DIAGNOSIS — I482 Chronic atrial fibrillation, unspecified: Secondary | ICD-10-CM | POA: Diagnosis not present

## 2022-02-16 DIAGNOSIS — Z8781 Personal history of (healed) traumatic fracture: Secondary | ICD-10-CM | POA: Diagnosis not present

## 2022-02-16 DIAGNOSIS — I444 Left anterior fascicular block: Secondary | ICD-10-CM | POA: Diagnosis present

## 2022-02-16 DIAGNOSIS — R3912 Poor urinary stream: Secondary | ICD-10-CM | POA: Diagnosis present

## 2022-02-16 DIAGNOSIS — I82402 Acute embolism and thrombosis of unspecified deep veins of left lower extremity: Secondary | ICD-10-CM | POA: Diagnosis not present

## 2022-02-16 DIAGNOSIS — I517 Cardiomegaly: Secondary | ICD-10-CM | POA: Insufficient documentation

## 2022-02-16 HISTORY — PX: INTRAMEDULLARY (IM) NAIL INTERTROCHANTERIC: SHX5875

## 2022-02-16 LAB — CBC WITH DIFFERENTIAL/PLATELET
Abs Immature Granulocytes: 0.05 10*3/uL (ref 0.00–0.07)
Basophils Absolute: 0.1 10*3/uL (ref 0.0–0.1)
Basophils Relative: 1 %
Eosinophils Absolute: 0.2 10*3/uL (ref 0.0–0.5)
Eosinophils Relative: 2 %
HCT: 39.7 % (ref 39.0–52.0)
Hemoglobin: 12.9 g/dL — ABNORMAL LOW (ref 13.0–17.0)
Immature Granulocytes: 1 %
Lymphocytes Relative: 15 %
Lymphs Abs: 1.3 10*3/uL (ref 0.7–4.0)
MCH: 33 pg (ref 26.0–34.0)
MCHC: 32.5 g/dL (ref 30.0–36.0)
MCV: 101.5 fL — ABNORMAL HIGH (ref 80.0–100.0)
Monocytes Absolute: 0.7 10*3/uL (ref 0.1–1.0)
Monocytes Relative: 8 %
Neutro Abs: 6.3 10*3/uL (ref 1.7–7.7)
Neutrophils Relative %: 73 %
Platelets: 146 10*3/uL — ABNORMAL LOW (ref 150–400)
RBC: 3.91 MIL/uL — ABNORMAL LOW (ref 4.22–5.81)
RDW: 14.2 % (ref 11.5–15.5)
WBC: 8.5 10*3/uL (ref 4.0–10.5)
nRBC: 0 % (ref 0.0–0.2)

## 2022-02-16 LAB — SURGICAL PCR SCREEN
MRSA, PCR: NEGATIVE
Staphylococcus aureus: NEGATIVE

## 2022-02-16 LAB — COMPREHENSIVE METABOLIC PANEL
ALT: 14 U/L (ref 0–44)
AST: 19 U/L (ref 15–41)
Albumin: 3.3 g/dL — ABNORMAL LOW (ref 3.5–5.0)
Alkaline Phosphatase: 52 U/L (ref 38–126)
Anion gap: 7 (ref 5–15)
BUN: 9 mg/dL (ref 8–23)
CO2: 26 mmol/L (ref 22–32)
Calcium: 9.4 mg/dL (ref 8.9–10.3)
Chloride: 106 mmol/L (ref 98–111)
Creatinine, Ser: 0.89 mg/dL (ref 0.61–1.24)
GFR, Estimated: >60 mL/min (ref >60)
Glucose, Bld: 117 mg/dL — ABNORMAL HIGH (ref 70–99)
Potassium: 4.5 mmol/L (ref 3.5–5.1)
Sodium: 139 mmol/L (ref 135–145)
Total Bilirubin: 1 mg/dL (ref 0.3–1.2)
Total Protein: 6.2 g/dL — ABNORMAL LOW (ref 6.5–8.1)

## 2022-02-16 LAB — GLUCOSE, CAPILLARY: Glucose-Capillary: 113 mg/dL — ABNORMAL HIGH (ref 70–99)

## 2022-02-16 LAB — MAGNESIUM: Magnesium: 2 mg/dL (ref 1.7–2.4)

## 2022-02-16 SURGERY — FIXATION, FRACTURE, INTERTROCHANTERIC, WITH INTRAMEDULLARY ROD
Anesthesia: General | Site: Hip | Laterality: Right

## 2022-02-16 MED ORDER — POVIDONE-IODINE 10 % EX SWAB
2.0000 | Freq: Once | CUTANEOUS | Status: AC
  Administered 2022-02-16: 2 via TOPICAL

## 2022-02-16 MED ORDER — HYDROCODONE-ACETAMINOPHEN 5-325 MG PO TABS: 1.0000 | ORAL_TABLET | Freq: Four times a day (QID) | ORAL | 0 refills | Status: DC | PRN

## 2022-02-16 MED ORDER — CEFAZOLIN SODIUM-DEXTROSE 2-4 GM/100ML-% IV SOLN
2.0000 g | INTRAVENOUS | Status: AC
  Administered 2022-02-16: 2 g via INTRAVENOUS

## 2022-02-16 MED ORDER — ENOXAPARIN SODIUM 40 MG/0.4ML IJ SOSY: 40.0000 mg | PREFILLED_SYRINGE | INTRAMUSCULAR | 0 refills | Status: DC

## 2022-02-16 MED ORDER — AMISULPRIDE (ANTIEMETIC) 5 MG/2ML IV SOLN: 10.0000 mg | Freq: Once | INTRAVENOUS | Status: DC | PRN

## 2022-02-16 MED ORDER — CHLORHEXIDINE GLUCONATE 0.12 % MT SOLN
OROMUCOSAL | Status: AC
  Administered 2022-02-16: 15 mL via OROMUCOSAL
  Filled 2022-02-16: qty 15

## 2022-02-16 MED ORDER — HYDROMORPHONE HCL 1 MG/ML IJ SOLN: 0.5000 mg | INTRAMUSCULAR | Status: DC | PRN

## 2022-02-16 MED ORDER — FENTANYL CITRATE (PF) 100 MCG/2ML IJ SOLN
INTRAMUSCULAR | Status: AC
  Administered 2022-02-16: 100 ug via INTRAVENOUS
  Filled 2022-02-16: qty 2

## 2022-02-16 MED ORDER — FENTANYL CITRATE (PF) 100 MCG/2ML IJ SOLN: 50.0000 ug | Freq: Once | INTRAMUSCULAR | Status: DC

## 2022-02-16 MED ORDER — 0.9 % SODIUM CHLORIDE (POUR BTL) OPTIME
TOPICAL | Status: DC | PRN
  Administered 2022-02-16: 1000 mL

## 2022-02-16 MED ORDER — ACETAMINOPHEN 325 MG PO TABS: 650.0000 mg | ORAL_TABLET | Freq: Four times a day (QID) | ORAL | Status: DC | PRN

## 2022-02-16 MED ORDER — ALBUMIN HUMAN 5 % IV SOLN
INTRAVENOUS | Status: AC
  Filled 2022-02-16: qty 250

## 2022-02-16 MED ORDER — PHENYLEPHRINE 80 MCG/ML (10ML) SYRINGE FOR IV PUSH (FOR BLOOD PRESSURE SUPPORT)
PREFILLED_SYRINGE | INTRAVENOUS | Status: AC
  Filled 2022-02-16: qty 10

## 2022-02-16 MED ORDER — PROPOFOL 10 MG/ML IV BOLUS
INTRAVENOUS | Status: DC | PRN
  Administered 2022-02-16 (×2): 20 mg via INTRAVENOUS

## 2022-02-16 MED ORDER — ALBUMIN HUMAN 5 % IV SOLN: INTRAVENOUS | Status: DC | PRN

## 2022-02-16 MED ORDER — CHLORHEXIDINE GLUCONATE 4 % EX LIQD: 60.0000 mL | Freq: Once | CUTANEOUS | Status: DC

## 2022-02-16 MED ORDER — ORAL CARE MOUTH RINSE: 15.0000 mL | Freq: Once | OROMUCOSAL | Status: AC

## 2022-02-16 MED ORDER — VANCOMYCIN HCL 1000 MG IV SOLR
INTRAVENOUS | Status: DC | PRN
  Administered 2022-02-16: 1000 mg via TOPICAL

## 2022-02-16 MED ORDER — INSULIN ASPART 100 UNIT/ML IJ SOLN: 0.0000 [IU] | INTRAMUSCULAR | Status: DC | PRN

## 2022-02-16 MED ORDER — PROMETHAZINE HCL 25 MG/ML IJ SOLN: 6.2500 mg | INTRAMUSCULAR | Status: DC | PRN

## 2022-02-16 MED ORDER — VANCOMYCIN HCL 1000 MG IV SOLR
INTRAVENOUS | Status: AC
  Filled 2022-02-16: qty 20

## 2022-02-16 MED ORDER — ALBUMIN HUMAN 5 % IV SOLN
12.5000 g | Freq: Once | INTRAVENOUS | Status: AC
  Administered 2022-02-16: 12.5 g via INTRAVENOUS

## 2022-02-16 MED ORDER — CEFAZOLIN SODIUM-DEXTROSE 2-4 GM/100ML-% IV SOLN
INTRAVENOUS | Status: AC
  Filled 2022-02-16: qty 100

## 2022-02-16 MED ORDER — LIDOCAINE HCL (CARDIAC) PF 100 MG/5ML IV SOSY
PREFILLED_SYRINGE | INTRAVENOUS | Status: DC | PRN
  Administered 2022-02-16: 20 mg via INTRATRACHEAL

## 2022-02-16 MED ORDER — BUPIVACAINE IN DEXTROSE 0.75-8.25 % IT SOLN
INTRATHECAL | Status: DC | PRN
  Administered 2022-02-16: 1.8 mL via INTRATHECAL

## 2022-02-16 MED ORDER — LACTATED RINGERS IV SOLN: INTRAVENOUS | Status: DC

## 2022-02-16 MED ORDER — ACETAMINOPHEN 650 MG RE SUPP: 650.0000 mg | Freq: Four times a day (QID) | RECTAL | Status: DC | PRN

## 2022-02-16 MED ORDER — PHENYLEPHRINE HCL-NACL 20-0.9 MG/250ML-% IV SOLN
INTRAVENOUS | Status: DC | PRN
  Administered 2022-02-16: 60 ug/min via INTRAVENOUS

## 2022-02-16 MED ORDER — CHLORHEXIDINE GLUCONATE 0.12 % MT SOLN: 15.0000 mL | Freq: Once | OROMUCOSAL | Status: AC

## 2022-02-16 MED ORDER — TRANEXAMIC ACID-NACL 1000-0.7 MG/100ML-% IV SOLN
1000.0000 mg | INTRAVENOUS | Status: AC
  Administered 2022-02-16: 1000 mg via INTRAVENOUS

## 2022-02-16 MED ORDER — IPRATROPIUM-ALBUTEROL 0.5-2.5 (3) MG/3ML IN SOLN: 3.0000 mL | RESPIRATORY_TRACT | Status: DC | PRN

## 2022-02-16 MED ORDER — VASOPRESSIN 20 UNIT/ML IV SOLN
INTRAVENOUS | Status: DC | PRN
  Administered 2022-02-16 (×3): 2 [IU] via INTRAVENOUS

## 2022-02-16 MED ORDER — TRANEXAMIC ACID-NACL 1000-0.7 MG/100ML-% IV SOLN
INTRAVENOUS | Status: AC
  Filled 2022-02-16: qty 100

## 2022-02-16 MED ORDER — ROPIVACAINE HCL 5 MG/ML IJ SOLN
INTRAMUSCULAR | Status: DC | PRN
  Administered 2022-02-16: 25 mL via PERINEURAL

## 2022-02-16 MED ORDER — VASOPRESSIN 20 UNIT/ML IV SOLN
INTRAVENOUS | Status: AC
  Filled 2022-02-16: qty 1

## 2022-02-16 MED ORDER — FENTANYL CITRATE (PF) 250 MCG/5ML IJ SOLN
INTRAMUSCULAR | Status: AC
  Filled 2022-02-16: qty 5

## 2022-02-16 MED ORDER — EPINEPHRINE 1 MG/10ML IJ SOSY
PREFILLED_SYRINGE | INTRAMUSCULAR | Status: AC
  Filled 2022-02-16: qty 10

## 2022-02-16 MED ORDER — LACTATED RINGERS IV SOLN: INTRAVENOUS | Status: DC | PRN

## 2022-02-16 MED ORDER — EPHEDRINE 5 MG/ML INJ
INTRAVENOUS | Status: AC
  Filled 2022-02-16: qty 5

## 2022-02-16 MED ORDER — FENTANYL CITRATE (PF) 100 MCG/2ML IJ SOLN: 25.0000 ug | INTRAMUSCULAR | Status: DC | PRN

## 2022-02-16 MED ORDER — PHENYLEPHRINE HCL (PRESSORS) 10 MG/ML IV SOLN
INTRAVENOUS | Status: DC | PRN
  Administered 2022-02-16 (×5): 80 ug via INTRAVENOUS

## 2022-02-16 SURGICAL SUPPLY — 46 items
ADH SKN CLS APL DERMABOND .7 (GAUZE/BANDAGES/DRESSINGS) ×1
APL PRP STRL LF DISP 70% ISPRP (MISCELLANEOUS) ×1
BAG COUNTER SPONGE SURGICOUNT (BAG) ×2 IMPLANT
BAG SPNG CNTER NS LX DISP (BAG) ×1
BIT DRILL CALIBRATED 4.2 (BIT) IMPLANT
BIT DRILL CANN 16 HIP (BIT) IMPLANT
BIT DRILL CANN STP 6/9 HIP (BIT) IMPLANT
BNDG COHESIVE 6X5 TAN NS LF (GAUZE/BANDAGES/DRESSINGS) ×2 IMPLANT
CHLORAPREP W/TINT 26 (MISCELLANEOUS) ×2 IMPLANT
CLSR STERI-STRIP ANTIMIC 1/2X4 (GAUZE/BANDAGES/DRESSINGS) IMPLANT
COVER PERINEAL POST (MISCELLANEOUS) ×2 IMPLANT
COVER SURGICAL LIGHT HANDLE (MISCELLANEOUS) ×2 IMPLANT
DERMABOND ADVANCED .7 DNX12 (GAUZE/BANDAGES/DRESSINGS) ×4 IMPLANT
DRAPE C-ARM 42X72 X-RAY (DRAPES) ×2 IMPLANT
DRAPE C-ARMOR (DRAPES) ×2 IMPLANT
DRAPE STERI IOBAN 125X83 (DRAPES) ×2 IMPLANT
DRILL BIT CALIBRATED 4.2 (BIT) ×1
DRSG AQUACEL AG ADV 3.5X 6 (GAUZE/BANDAGES/DRESSINGS) IMPLANT
DRSG MEPILEX BORDER 4X8 (GAUZE/BANDAGES/DRESSINGS) ×4 IMPLANT
ELECT REM PT RETURN 9FT ADLT (ELECTROSURGICAL) ×1
ELECTRODE REM PT RTRN 9FT ADLT (ELECTROSURGICAL) ×2 IMPLANT
GLOVE BIOGEL PI IND STRL 8 (GLOVE) ×4 IMPLANT
GLOVE ECLIPSE 7.5 STRL STRAW (GLOVE) ×4 IMPLANT
GLOVE SURG ORTHO LTX SZ7.5 (GLOVE) ×4 IMPLANT
GOWN STRL REIN XL XLG (GOWN DISPOSABLE) ×2 IMPLANT
GOWN STRL REUS W/ TWL LRG LVL3 (GOWN DISPOSABLE) ×2 IMPLANT
GOWN STRL REUS W/ TWL XL LVL3 (GOWN DISPOSABLE) ×2 IMPLANT
GOWN STRL REUS W/TWL LRG LVL3 (GOWN DISPOSABLE) ×1
GOWN STRL REUS W/TWL XL LVL3 (GOWN DISPOSABLE) ×1
GUIDEWIRE 3.2X400 (WIRE) IMPLANT
KIT BASIN OR (CUSTOM PROCEDURE TRAY) ×2 IMPLANT
MANIFOLD NEPTUNE II (INSTRUMENTS) ×2 IMPLANT
NAIL TROCH FIX 10X235 RT 130 (Nail) IMPLANT
NS IRRIG 1000ML POUR BTL (IV SOLUTION) ×2 IMPLANT
PACK GENERAL/GYN (CUSTOM PROCEDURE TRAY) ×2 IMPLANT
PAD ARMBOARD 7.5X6 YLW CONV (MISCELLANEOUS) ×4 IMPLANT
SCREW FENES TFNA 100 ST (Screw) IMPLANT
SCREW LOCK IM TI 5X40 STRL (Screw) IMPLANT
SPONGE T-LAP 18X18 ~~LOC~~+RFID (SPONGE) ×4 IMPLANT
SUT MNCRL AB 3-0 PS2 18 (SUTURE) ×4 IMPLANT
SUT MON AB 2-0 CT1 36 (SUTURE) ×4 IMPLANT
SUT PDS AB 1 CT  36 (SUTURE) ×1
SUT PDS AB 1 CT 36 (SUTURE) ×4 IMPLANT
SUT VIC AB 0 CT1 27 (SUTURE) ×2
SUT VIC AB 0 CT1 27XBRD ANBCTR (SUTURE) ×4 IMPLANT
TOWEL GREEN STERILE (TOWEL DISPOSABLE) ×4 IMPLANT

## 2022-02-16 NOTE — Anesthesia Postprocedure Evaluation (Signed)
Anesthesia Post Note  Patient: Phillip West  Procedure(s) Performed: INTRAMEDULLARY (IM) NAIL INTERTROCHANTERIC (Right: Hip)     Patient location during evaluation: PACU Anesthesia Type: Spinal and MAC Level of consciousness: awake and alert Pain management: pain level controlled Vital Signs Assessment: post-procedure vital signs reviewed and stable Respiratory status: spontaneous breathing and respiratory function stable Cardiovascular status: blood pressure returned to baseline and stable Postop Assessment: spinal receding Anesthetic complications: no   No notable events documented.               Chantz Montefusco DANIEL

## 2022-02-16 NOTE — Op Note (Signed)
OPERATIVE NOTE  Phillip West male 88 y.o. 02/16/2022  PREOPERATIVE DIAGNOSIS: Right displaced intertrochanteric proximal femur fracture Right proximal femur fracture due to osteoporosis  POSTOPERATIVE DIAGNOSIS: Right displaced intertrochanteric proximal femur fracture (S72.141A) Osteoporosis with current right proximal femur fracture insufficiency fracture due to osteoporosis (M80.051A)  PROCEDURE(S): Open treatment right intertrochanteric fracture with cephalomedullary nail (20254) Operative use of fluoroscopy for above procedure(s) (76000)   SURGEON: Georgeanna Harrison, M.D.  ASSISTANT(S): None  ANESTHESIA: General  FINDINGS: Preoperative Examination: Right Lower Extremity: Inspection: Slightly shortened held in external rotation Palpation: Tender to palpation over hip at site of known peritrochanteric fracture ROM: Hip range of motion severely limited due to pain Strength: Intact dorsiflexion, plantarflexion, EHL Sensation: Intact light touch distally in superficial peroneal, deep peroneal, and tibial distributions Skin: Intact Peripheral Vascular: Normal DP pulse, warm well-perfused distally  Operative Findings: Displaced right intertrochanteric proximal femur fracture.  Closed reduction obtained with traction and ligamentotaxis on Hana table, confirmed on orthogonal AP and fluoroscopic views.  Appropriate placement of intermediate length cephalomedullary nail including lag screw and interlocking bolts, resulting in successful stabilization of the fracture.  Appropriate placement confirmed on orthogonal AP and fluoroscopic views.  IMPLANTS: Implant Name Type Inv. Item Serial No. Manufacturer Lot No. LRB No. Used Action  NAIL TROCH FIX [id number] RT 130 - [id number] Nail NAIL TROCH FIX [id number] RT 130  [employer] [id number] Right 1 Implanted  SCREW FENES TFNA 100 ST - [id number] Screw SCREW FENES TFNA 100 ST  [employer] [id number] Right 1 Implanted  SCREW LOCK IM TI  5X40 STRL - [id number] Screw SCREW LOCK IM TI 5X40 STRL  [employer] [id number] Right 1 Implanted    INDICATIONS:  The patient is a 88 y.o. male who does experience intermittent syncopal episodes contributing to falls.  He had 1 such episode today when he was walking to his mailbox he fell in his driveway.  He presented to the emergency room where he was found to have sustained a right peritrochanteric proximal femur fracture.  Due to the morbidity associated with nonoperative treatment, he did wish to proceed with operative treatment like he had on the contralateral side approximately 2 years ago, in hopes of facilitating restoration of mobility and quicker return to function.  He understood the risks, benefits and alternatives to surgery which include but are not limited to bleeding, wound healing complications, infection, damage to surrounding structures, persistent pain, stiffness, lack of improvement, potential for subsequent arthritis or worsening of pre-existing arthritis, nonunion, malunion, and need for further surgery, as well as complications related to anesthesia, cardiovascular complications, and death.  He also understood the potential for continued pain, and that there were no guarantees of acceptable outcome.  After weighing these risks the patient opted to proceed with surgery.  TECHNIQUE: Patient was identified in the preoperative holding area.  The right hip was confirmed as the appropriate operative site and marked by me.  Consent was signed by myself and the patient and witnessed by the preoperative nurse.  No block was performed by anesthesia in the preoperative holding area.  Patient was taken to the operative suite and placed supine on the operative table.  Anesthesia was induced by the anesthesia team.  The patient was positioned appropriately for the procedure and all bony prominences were well padded.  A tourniquet was not used.  Preoperative antibiotics were given. The  extremity was prepped and draped in the usual sterile fashion and surgical timeout was performed.  Surface anatomy was marked out  laterally.  The incision for the trochanteric starting point was identified under fluoroscopic guidance.  A short curvilinear incision proximal to the greater trochanter was marked on skin.  Skin was incised sharply.  Underlying subcutaneous tissue was dissected with Bovie electrocautery down to fascia layer.  Fascia was split sharply in line fibers.  Underlying muscle was split bluntly in line with fibers, exposing the tip of the greater trochanter.  Under fluoroscopic guidance, the starting wire was positioned to the trochanteric entry point and advanced to center center position into the proximal intramedullary canal of the proximal femur.  Appropriate wire placement was confirmed on orthogonal AP and lateral fluoroscopic views.  The proximal femur was then cannulated with the entry reamer.  Starting wire entry reamer were withdrawn.  Intermediate length 10 mm diameter nail was mounted on the jig impacted into position under fluoroscopic guidance.  Appropriate intramedullary placement confirmed on orthogonal AP and lateral fluoroscopic views.  Incision for the lag screw was noted laterally on the skin.  Skin was incised sharply.  Underlying subcutaneous tissue IT band muscle was split sharply with a separate deep knife exposing lateral femoral cortex.  Triple sleeve trocar was advanced in position of the lateral femoral cortex.  Guidewire for the lag screw was advanced proximally into position under fluoroscopic guidance, monitoring on orthogonal AP and lateral views, taking care to avoid subchondral penetration.  Measurement was obtained off of the wire and a path for the lag screw was drilled.  A 110 mm lag screw was advanced into position over the wire.  The construct was compressed and then statically locked.  Through the same lateral incision, triple sleeve trocar was inserted and  advanced under the lateral femoral cortex for the distal interlocking screw through the jig.  A single distal interlock screw was placed through the nail through the jig.  Final x-rays were obtained including AP and lateral fluoroscopic views confirming reduction of the fracture with appropriate placement of the cephalomedullary nail.  Wounds were copiously irrigated and hemostasis was obtained.  1 g vancomycin powder was placed deep in all the wounds.  Layered closure was performed with figure-of-eight #1 PDS closing the fascia, followed by simple inverted interrupted #0 Vicryl in the deeper fat, followed by simple inverted interrupted #2-0 Monocryl deep dermal, followed by running #3 Monocryl subcuticular.  Skin was sealed with Dermabond.  Suture tails secured with Steri-Strips.  Aquacel dressings were placed over the wounds.  Patient was transferred back to the hospital bed and then transferred to PACU in stable condition.  He tolerated procedure well.  No complications were noted intraoperatively.  POSTOPERATIVE INSTRUCTIONS: Mobility: Out of bed with PT/OT Pain control: Continue to wean/titrate to appropriate oral regimen DVT Prophylaxis: Lovenox 40 mg daily for 6 weeks postoperatively Further surgical plans: None RLE: Weightbearing as tolerated, no restrictions, including no range of motion restrictions Dressing care: Keep AQUACEL on and dry for up to 14 days.  Do not allow surgical area to get wet before that.  Remove AQUACEL dressing after 14 days and allow area to get wet in shower but DO NOT SUBMERGE until wound is evaluated in clinic.  In most cases skin glue is used and no additional dressing is necessary.  Disposition: Per primary team as medically appropriate Follow-up: Please call [facility] [phone]) to schedule follow-op appointment for 2 weeks after surgery. The patient's postoperative prescriptions (e.g. pain medication, anticoagulation) have been  printed, signed, and placed in/on the patient's hard chart: Lovenox  and Norco  I have verified that my discharge instructions and follow-up information have been entered in the Discharge Navigator in Epic.  These should automatically populate in the AVS.  Please print the AVS in its entirety and ensure that the patient or a responsible party has a complete copy of the AVS before they are discharged.  If there are questions regarding discharge instructions or follow-up before the AVS is generated, please check the Discharge Navigator before attempting to contact the surgeon/office.  If unsure how to access the Discharge Navigator or the information contained in the Discharge Navigator, or how to generate/print the AVS, please contact the appropriate Nurse, learning disability.   TOURNIQUET TIME: * No tourniquets in log *  BLOOD LOSS: 100 mL         DRAINS: none         SPECIMEN: none       COMPLICATIONS:  * No complications entered in OR log *         DISPOSITION: PACU - hemodynamically stable.         CONDITION: stable   Georgeanna Harrison M.D. Orthopaedic Surgery [facility]   Portions of the record have been created with voice recognition software.  Grammatical and punctuation errors, random word insertions, wrong-word or "sound-a-like" substitutions, pronoun errors (inaccuracies and/or substitutions), and/or incomplete sentences may have occurred due to the inherent limitations of voice recognition software.  Not all errors are caught or corrected.  Although every attempt is made to root out erroneous and incomplete transcription, the note may still not fully represent the intent or opinion of the author.  Read the chart carefully and recognize, using context, where errors/substitutions have occurred.  Any questions or concerns about the content of this note or information contained within the body of this dictation should be addressed directly with the author for  clarification.

## 2022-02-16 NOTE — Anesthesia Procedure Notes (Addendum)
Spinal  Patient location during procedure: OR Start time: 02/16/2022 7:46 PM End time: 02/16/2022 7:56 PM Reason for block: surgical anesthesia Staffing Performed: anesthesiologist  Anesthesiologist: Duane Boston, MD Performed by: Duane Boston, MD Authorized by: Duane Boston, MD   Preanesthetic Checklist Completed: patient identified, IV checked, risks and benefits discussed, surgical consent, monitors and equipment checked, pre-op evaluation and timeout performed Spinal Block Patient position: sitting Prep: DuraPrep Patient monitoring: cardiac monitor, continuous pulse ox and blood pressure Approach: right paramedian Location: L2-3 Injection technique: single-shot Needle Needle type: Quincke  Needle gauge: 22 G Needle length: 9 cm Assessment Events: CSF return Additional Notes Functioning IV was confirmed and monitors were applied. Sterile prep and drape, including hand hygiene and sterile gloves were used. The patient was positioned and the spine was prepped. The skin was anesthetized with lidocaine.  Free flow of clear CSF was obtained prior to injecting local anesthetic into the CSF.  The spinal needle aspirated freely following injection.  The needle was carefully withdrawn.  The patient tolerated the procedure well.

## 2022-02-16 NOTE — Hospital Course (Addendum)
[name] is a 88 yo M with a PMH of HTN, HLD, chronic atrial fibrillation, thoracic/abdominal aortic aneurysm (ascending aorta 5.9cm) c/b moderate aortic valve insufficiency, tobacco use disorder, COPD, h/o L femur fracture 2021, and T2DM.   Patient reports that he was walking outside to his front yard to place something in his mailbox earlier on the day of admission.  While he was turning around to walk back to his house he states that he got progressively more dizzy causing him to fall.  He attempted to grab onto telephone pole, but unfortunately hit his right arm and right hip.  Patient reports that he was able to get to the hospital because bystanders called 911 for him.    Patient does note that this a.m. he leaned his head too far back while putting in his eyedrops and became dizzy.  He states that this has been going on for years and sometimes these episodes will last all day, but they eventually subside.  He has also had dizzy spells while walking, but is unclear what precipitates these episodes.   He denies any increased dyspnea noting at baseline he has DOE from his COPD, preceding diaphoresis, changes in visions or chest pain. He also denies syncope or fear of falling.   Of note, patient recently had a mechanical fall 12/2021 and sustained a left radial fracture.  Patient also reports that in 2021 he had a syncopal episode fell and sustained a left hip fracture.  Patient states that he has been somewhat afraid to put weight on his left lower extremity and this sometimes alters his gait.  He also states that when he has to walk on uneven surfaces like grass, gravel driveway etc. this is becoming increasingly more difficult to navigate and he feels he may fall.   PMH:  Aortic aneurysm  inoperable per surgeon (5.9 cm) Permanent Afib on metoprolol and digoxin not on anticoagulation (manages his own medicines) COPD  gets short of breath with walking, but able to do ADLs without any  difficulty  Medications:  Digoxin Midodrine Symbicort Flomax Albuterol  SH:  Patient is retired Social research officer, government, he was a Community education officer.  He was born in [place] but lived all over the world.  He settled in [place] as this is where his mother-in-law lived.  He has 1 son he stays with.  He does not drink any alcohol but does smoke a pack per day of cigarettes since he was 27 or 88 years old.  1  He follows with a primary care physician and cardiologist at the [facility] in [place].  He last saw his cardiologist last month and he sees his PCP every 3 months.   Full code   10/4: Pt stated last dizziness episode lasted about an hour and a half  -Has a history of recurrent falls   - Complaining of lack of transparency from primary care provider   - Will try to look at records  - PT will do orthostatics  - MRI of brain  - Had diabetes but PCP told him it went away, has had some low blood sugars and low blood pressures -Vestibular PT    10/6: -Has not been able to walk yet, as he believes he is not ready yet.                 Continues to endorse dizziness  Endorses dizziness when laying flat  - Hip Is not hurting now, but he's aware it's there,  states he's fine  - Last administration of Norco was 10/4 at 12:17PM  - When he fell he felt like his heart was racing and skipping beats  10/11 Patient feels good today. His dizziness is much better and doesn't last as long when it does come on. No blood in urine to his knowledge and denies dysuria, weak stream. Able to stand with PT. Patient is eager to head to [facility]. He does have some concerns about insurance / [facility] but encouraged discussion with case Freight forwarder. Denies chest pains, palpitations.  Exam:  NAD, well appearing elderly male sitting up in bed heart regular rate, irregular rhythm.  Lungs, normal work of breathing on RA Distal extremities warm and well perfused Normal mood and affect No m/r/g, normal work of  breathing on room air

## 2022-02-16 NOTE — Transfer of Care (Signed)
Immediate Anesthesia Transfer of Care Note  Patient: Phillip West  Procedure(s) Performed: INTRAMEDULLARY (IM) NAIL INTERTROCHANTERIC (Right: Hip)  Patient Location: PACU  Anesthesia Type:Spinal  Level of Consciousness: awake  Airway & Oxygen Therapy: Patient Spontanous Breathing and Patient connected to nasal cannula oxygen  Post-op Assessment: Report given to RN and Post -op Vital signs reviewed and stable  Post vital signs: Reviewed and stable  Last Vitals:  Vitals Value Taken Time  BP 85/50 02/16/22 2130  Temp    Pulse 78 02/16/22 2132  Resp 15 02/16/22 2132  SpO2 90 % 02/16/22 2132  Vitals shown include unvalidated device data.  Last Pain:  Vitals:   02/16/22 1828  TempSrc: Oral  PainSc: 8          Complications: No notable events documented.

## 2022-02-16 NOTE — Consult Note (Signed)
Reason for Consult:Right hip fx Referring Physician: Cindee Lame Time called: [id number] Time at bedside: 1227   Phillip West is an 88 y.o. male.  HPI: Phillip West got dizzy and fell at home this morning while going to the mailbox. He had immediate right hip pain and could not get up. He was brought to the ED where x-rays showed a right hip fx and orthopedic surgery was consulted. He lives at home and ambulates with either a cane or RW.  Past Medical History:  Diagnosis Date   Abdominal aortic aneurysm (AAA) (HCC)    Atrial fibrillation ([place])    Chalazion right lower eyelid    COPD (chronic obstructive pulmonary disease) (HCC)    Diabetes mellitus ([place])    Gross hematuria    Hypertension    Syncope and collapse    Thoracic aortic aneurysm (TAA) ([place])     Past Surgical History:  Procedure Laterality Date   HERNIA REPAIR     INTRAMEDULLARY (IM) NAIL INTERTROCHANTERIC Left 04/26/2020   Procedure: INTRAMEDULLARY (IM) NAIL INTERTROCHANTRIC;  Surgeon: Erle Crocker, MD;  Location: [facility];  Service: Orthopedics;  Laterality: Left;   KNEE ARTHROSCOPY      Family History  Problem Relation Age of Onset   Heart disease Father    Stroke Father     Social History:  reports that he has been smoking cigarettes. He started smoking about 50 years ago. He has been smoking an average of 1 pack per day. He has never used smokeless tobacco. He reports that he does not currently use alcohol. He reports that he does not use drugs.  Allergies:  Allergies  Allergen Reactions   Quinolones     Patient was warned about not using Cipro and similar antibiotics. Recent studies have raised concern that fluoroquinolone antibiotics could be associated with an increased risk of aortic aneurysm Fluoroquinolones have non-antimicrobial properties that might jeopardise the integrity of the extracellular matrix of the vascular wall In a  propensity score matched cohort study in [place], there was a 66% increased  rate of aortic aneurysm or dissection associated with oral fluoroquinolone use, compared wit   Ciprofloxacin     Other reaction(s): IRREGULAR HEART RATE    Medications: I have reviewed the patient's current medications.  No results found for this or any previous visit (from the past 48 hour(s)).  DG Femur Min 2 Views Right  Result Date: 02/16/2022 CLINICAL DATA:  Fall earlier today landing on right side with right hip pain. EXAM: RIGHT FEMUR 2 VIEWS COMPARISON:  None Available. FINDINGS: Diffuse decreased bone mineralization. Evidence of patient's displaced slightly comminuted intertrochanteric fracture of the right proximal femur. Mild degenerative change of the right hip and knee. IMPRESSION: Displaced slightly comminuted intertrochanteric fracture of the right proximal femur. Electronically Signed   By: Marin Olp M.D.   On: 02/16/2022 12:21   DG Pelvis 1-2 Views  Result Date: 02/16/2022 CLINICAL DATA:  Fall today landing on right side with right hip pain. EXAM: PELVIS - 1-2 VIEW COMPARISON:  04/25/2020 FINDINGS: There is a displaced slightly comminuted fracture of the intertrochanteric region of the right femur. Mild superolateral displacement of the predominant distal fragment. Mild bilateral degenerative changes of the hips. Partially visualized hardware over the left femur intact. There is diffuse decreased bone mineralization present. Mild degenerate change of the spine. IMPRESSION: Displaced slightly comminuted intertrochanteric fracture of the right femur. Electronically Signed   By: Marin Olp M.D.   On: 02/16/2022 12:16  Review of Systems  HENT:  Negative for ear discharge, ear pain, hearing loss and tinnitus.   Eyes:  Negative for photophobia and pain.  Respiratory:  Negative for cough and shortness of breath.   Cardiovascular:  Negative for chest pain.  Gastrointestinal:  Negative for abdominal pain, nausea and vomiting.  Genitourinary:  Negative for dysuria, flank pain,  frequency and urgency.  Musculoskeletal:  Positive for arthralgias (Right hip). Negative for back pain, myalgias and neck pain.  Neurological:  Negative for dizziness and headaches.  Hematological:  Does not bruise/bleed easily.  Psychiatric/Behavioral:  The patient is not nervous/anxious.    Blood pressure 111/62, pulse 80, temperature (!) 97.5 F (36.4 C), temperature source Oral, resp. rate 14, height '5\' 8"'$  (1.727 m), weight 68 kg, SpO2 99 %. Physical Exam Constitutional:      General: He is not in acute distress.    Appearance: He is well-developed. He is not diaphoretic.  HENT:     Head: Normocephalic and atraumatic.  Eyes:     General: No scleral icterus.       Right eye: No discharge.        Left eye: No discharge.     Conjunctiva/sclera: Conjunctivae normal.  Cardiovascular:     Rate and Rhythm: Normal rate and regular rhythm.  Pulmonary:     Effort: Pulmonary effort is normal. No respiratory distress.  Musculoskeletal:     Cervical back: Normal range of motion.     Comments: RLE No traumatic wounds, ecchymosis, or rash  Mild TTP hip  No knee or ankle effusion  Knee stable to varus/ valgus and anterior/posterior stress  Sens DPN, SPN, TN intact  Motor EHL, ext, flex, evers 5/5  DP 1+, PT 1+, No significant edema  Skin:    General: Skin is warm and dry.  Neurological:     Mental Status: He is alert.  Psychiatric:        Mood and Affect: Mood normal.        Behavior: Behavior normal.     Assessment/Plan: Right hip fx -- Plan IMN tonight by Dr. Mable Fill. Please keep NPO. Multiple medical problems including HTN, HLD, chronic A-fib, thoracic/abdominal aortic aneurysm, tobacco abuse, COPD, history of left femur fracture, T2DM, and cardiomegaly -- per primary service    Lisette Abu, PA-C Orthopedic Surgery [phone] 02/16/2022, 12:33 PM

## 2022-02-16 NOTE — Discharge Instructions (Addendum)
Discharge instructions for Dr. Georgeanna Harrison, M.D., Orthopaedic Surgeon, [place]:  Diet: As you were doing prior to hospitalization, unless instructed otherwise by medical team, dietary/nutrition team, etc. Dressing:  Keep AQUACEL on and dry for up to 14 days.  Do not allow surgical area to get wet before that.  Remove AQUACEL dressing after 14 days and allow area to get wet in shower but DO NOT SUBMERGE until wound is evaluated in clinic.  In most cases skin glue is used and no additional dressing is necessary.  Shower:  Unless otherwise specified, may shower but keep the wounds dry, use an occlusive plastic wrap, NO SOAKING IN TUB.  If the bandage gets wet, change with a clean dry gauze.  Unless otherwise specified, after 5 days dressing(s) should be removed (7 days for PREVENA dressings) and wound(s) may get wet in the shower by allowing water to gently run over.  Again, no soaking in tub, and do NOT submerge for at least 2 weeks!!! Activity:  Increase activity slowly as tolerated.  If you right leg is injured or immobilized, no driving for 6 weeks or until discussed with your surgeon.  If you have an injury or immobilization of the left lower extremity you may not operate a clutch. Please note that driving with any kind of immobilization for the upper extremity (sling, shoulder brace, splint, cast, etc.) may also be considered impaired driving and should not be attempted. Weight Bearing: WEIGHTBEARING AS TOLERATED (WBAT) on the RIGHT LOWER EXTREMITY. To prevent constipation: You may use over-the-counter stool softener(s) such as Colace (over the counter) 100 mg by mouth twice a day and/or Miralax (over the counter) for constipation as needed.  Drink plenty of fluids (prune juice may be helpful) and high fiber foods.  Itching:  If you experience itching with your medications, try taking only a single pain pill, or even half a pain pill at a time.  You can also use  benadryl over the counter for itching or also to help with sleep.  Precautions:  If you experience chest pain or shortness of breath - call 911 immediately for transfer to the hospital emergency department!! Medications: Please contact the clinic during office hours (Monday through Friday, 0800 to 1600) if you need a refill on any medications.  Please monitor medications and allow 24 to 48 hours to process refill request!!!!  Please note that only medications directly related to the surgery can be prescribed.  For other medications (e.g. blood pressure medicines, sleeping medicines, etc.), please contact the prescribing physician or your primary care provider. DVT Prophylaxis: Lovenox 40 mg daily x6 weeks postoperatively  If you develop a fever greater that 101.1 deg F, purulent drainage from wound, increased redness or drainage from wound, or calf pain -- Call the office at [phone].    Information on my medicine - XARELTO (Rivaroxaban)  This medication education was reviewed with me or my healthcare representative as part of my discharge preparation.  Why was Xarelto prescribed for you? Xarelto was prescribed for you to reduce the risk of blood clots forming after orthopedic surgery. The medical term for these abnormal blood clots is venous thromboembolism (VTE).  What do you need to know about xarelto ? Take your Xarelto ONCE DAILY at the same time every day. You may take it either with or without food.  If you have difficulty swallowing the tablet whole, you may crush it and mix in applesauce just prior to taking your dose.  Take Xarelto exactly as prescribed by your doctor and DO NOT stop taking Xarelto without talking to the doctor who prescribed the medication.  Stopping without other VTE prevention medication to take the place of Xarelto may increase your risk of developing a clot.  After discharge, you should have regular check-up appointments with your healthcare provider  that is prescribing your Xarelto.    What do you do if you miss a dose? If you miss a dose, take it as soon as you remember on the same day then continue your regularly scheduled once daily regimen the next day. Do not take two doses of Xarelto on the same day.   Important Safety Information A possible side effect of Xarelto is bleeding. You should call your healthcare provider right away if you experience any of the following: Bleeding from an injury or your nose that does not stop. Unusual colored urine (red or dark brown) or unusual colored stools (red or black). Unusual bruising for unknown reasons. A serious fall or if you hit your head (even if there is no bleeding).  Some medicines may interact with Xarelto and might increase your risk of bleeding while on Xarelto. To help avoid this, consult your healthcare provider or pharmacist prior to using any new prescription or non-prescription medications, including herbals, vitamins, non-steroidal anti-inflammatory drugs (NSAIDs) and supplements.  This website has more information on Xarelto: [email or web address]

## 2022-02-16 NOTE — H&P (Cosign Needed)
Date: 02/16/2022               Patient Name:  Phillip West MRN: [id number]  DOB: 05-05-1930 Age / Sex: 88 y.o., male   PCP: Windy Fast, MD         Medical Service: Internal Medicine Teaching Service         Attending Physician: Dr. Velna Ochs, MD    First Contact: Dr. Linward Natal  Pager: [phone]  Second Contact: Dr. Rick Duff  Pager: [phone]       After Hours (After 5p/  First Contact Pager: [phone]  weekends / holidays): Second Contact Pager: [phone]   Chief Complaint: R hip fracture  History of Present Illness:   [name] is a 88 yo M with a PMH of HTN, HLD, chronic atrial fibrillation, thoracic/abdominal aortic aneurysm (ascending aorta 5.9cm) c/b moderate aortic valve insufficiency, tobacco use disorder, COPD, h/o L femur fracture 2021, and T2DM.   Patient reports that he was walking outside to his front yard to place something in his mailbox earlier on the day of admission.  While he was turning around to walk back to his house he states that he got progressively more dizzy causing him to fall.  He attempted to grab onto telephone pole, but unfortunately hit his right arm and right hip.  Patient reports that he was able to get to the hospital because bystanders called 911 for him.    Patient does note that this a.m. he leaned his head too far back while putting in his eyedrops and became dizzy.  He states that this has been going on for years and sometimes these episodes will last all day, but they eventually subside.  He has also had dizzy spells while walking, but is unclear what precipitates these episodes.   He denies any increased dyspnea noting at baseline he has DOE from his COPD, preceding diaphoresis, changes in visions or chest pain. He also denies syncope or fear of falling.   Of note, patient recently had a mechanical fall 12/2021 and sustained a left radial fracture.  Patient also reports that in 2021 he had a syncopal episode  fell and sustained a left hip fracture.  Patient states that he has been somewhat afraid to put weight on his left lower extremity and this sometimes alters his gait.  He also states that when he has to walk on uneven surfaces like grass, gravel driveway etc. this is becoming increasingly more difficult to navigate and he feels he may fall.   PMH:  Aortic aneurysm  inoperable per surgeon (5.9 cm) Permanent Afib on metoprolol and digoxin not on anticoagulation (manages his own medicines) COPD  gets short of breath with walking, but able to do ADLs without any difficulty  Medications:  Digoxin Midodrine Symbicort Flomax Albuterol  SH:  Patient is retired Social research officer, government, he was a Community education officer.  He was born in [place] but lived all over the world.  He settled in [place] as this is where his mother-in-law lived.  He has 1 son he stays with.  He does not drink any alcohol but does smoke a pack per day of cigarettes since he was 37 or 88 years old.    He follows with a primary care physician and cardiologist at the [place] in [place].  He last saw his cardiologist last month and he sees his PCP every 3 months.   Full code  Meds:  Current Meds  Medication  Sig   albuterol (VENTOLIN HFA) 108 (90 Base) MCG/ACT inhaler Inhale 1 puff into the lungs every 6 (six) hours as needed for wheezing or shortness of breath.   Cholecalciferol (VITAMIN D) 50 MCG (2000 UT) tablet Take 1 tablet by mouth daily.   digoxin (LANOXIN) 0.125 MG tablet Take 1 tablet (0.125 mg total) by mouth daily.   LUBRICATING PLUS EYE DROPS 0.5 % SOLN Place 1 drop into both eyes daily.   metoprolol tartrate (LOPRESSOR) 25 MG tablet Take 0.5 tablets (12.5 mg total) by mouth 2 (two) times daily.   midodrine (PROAMATINE) 5 MG tablet Take 1 tablet (5 mg total) by mouth 3 (three) times daily with meals.   pravastatin (PRAVACHOL) 40 MG tablet Take 1 tablet by mouth daily.   SYMBICORT 160-4.5 MCG/ACT inhaler Inhale 2 puffs into  the lungs 2 (two) times daily.   tamsulosin (FLOMAX) 0.4 MG CAPS capsule Take 0.4 mg by mouth daily.     Allergies: Allergies as of 02/16/2022 - Review Complete 02/16/2022  Allergen Reaction Noted   Quinolones Other (See Comments) 04/26/2019   Ciprofloxacin Other (See Comments) 06/20/2019   Past Medical History:  Diagnosis Date   Abdominal aortic aneurysm (AAA) ([facility])    Atrial fibrillation ([facility])    Chalazion right lower eyelid    COPD (chronic obstructive pulmonary disease) ([facility])    Diabetes mellitus ([facility])    Gross hematuria    Hypertension    Syncope and collapse    Thoracic aortic aneurysm (TAA) ([facility])     Family History:   Family History  Problem Relation Age of Onset   Heart disease Father    Stroke Father      Social History:   Patient is retired Social research officer, government, he was a Community education officer.  He was born in [place] but lived all over the world.  He settled in [place] as this is where his mother-in-law lived.  He has 1 son he stays with.  He does not drink any alcohol but does smoke a pack per day of cigarettes since he was 19 or 87 years old.    He follows with a primary care physician and cardiologist at the [place] in [place].  He last saw his cardiologist last month and he sees his PCP every 3 months.  Review of Systems: A complete ROS was negative except as per HPI.   Physical Exam: Blood pressure 109/85, pulse 79, temperature 97.6 F (36.4 C), resp. rate 18, height '5\' 8"'$  (1.727 m), weight 68 kg, SpO2 95 %.  Constitutional: Well-developed, well-nourished, mild discomfort. Lying flat.  HENT:  Head: Normocephalic and atraumatic.  Neck: Normal range of motion.  Cardiovascular: Normal rate, regular rhythm, intact distal pulses. No gallop and no friction rub.  No murmur heard. No lower extremity edema  Pulmonary: Non labored breathing on room air, no wheezing or rales  Abdominal: Soft. Normal bowel sounds. Non distended and non tender Musculoskeletal: RLE   Neurological: Alert and oriented to person, place, and time. Non focal  Skin: Skin is warm and dry.    EKG: personally reviewed my interpretation is atrial fibrillation without RVR.   R femur xray:  IMPRESSION: Displaced slightly comminuted intertrochanteric fracture of the right proximal femur.   Assessment & Plan by Problem: Principal Problem:   Hip fracture [facility]) Mr. Jarriel Papillion is a 88 yo M with a PMH of HTN, HLD, chronic atrial fibrillation, thoracic/abdominal aortic aneurysm (ascending aorta 5.9cm) c/b moderate aortic valve insufficiency, tobacco use disorder, COPD,  h/o L femur fracture 2021, and T2DM who presented after a fall in the setting of vertiginous symptoms found to have R comminuted intertrochanteric proximal femur fracture.   #R comminuted intertrochanteric proximal femur fracture  Patient had a fall after coming dizzy.  He sustained a right femur fracture.  Patient is neurovascularly intact.  Orthopedic surgery was consulted while patient was in the ED with plans for OR this p.m. -Pain control -PT/OT   #Fall #Vertiginous symptoms  Patient reports that for many years he has experienced episodes where he becomes dizzy.  He notes that sometimes the episodes are provoked by positional changes but tend to persist for multiple hours at a time.  He also states that there are episodes when the dizziness is not provoked.  The episodes tend to remit spontaneously.  Patient denies any orthostatic symptoms.  He also denies any palpitations or recent syncope.  Of note, when patient sustained a left femur fracture he notes that he did have a syncopal episode at that time.  Patient also reports that he has had an altered gait after sustaining a left femur fracture which she feels predisposes him to falling.  He also notes that he tends to have increased difficulty navigating uneven surfaces.  Unclear exact etiology of patient's vertiginous symptoms.  HI NTS exam not able to be  performed as patient was in significant pain with movement.  The intermittent nature of his symptoms without any associated features along with their occasional spontaneous as well as positionally triggered onset makes peripheral versus central causes possible. Patient has no other neurological deficits but this does not preclude CVA. He also has permanent atrial fibrillation and could be intermittently in RVR leading him to have vertigo. Patient also has a history of moderate aortic valve regurgitation in the setting of ascending aortic aneurysm which could be contributing to his symptoms. Finally, he is on a beta blocker as well as flomax which could cause him to occasionally feel symptomatic.  -Will consider MRI of the brain to rule out central causes of his vertigo as he has not had head imaging recently -PT/OT when he is out of the OR.   #Permanent atrial fibrillation  Patient in atrial fibrillation on admission.  His blood pressures are within normal limits.  He is currently on digoxin as well as metoprolol tartrate for this.  Anticoagulation is precluded by his falls. CHADSvasc score 3. HAS Bled score 2.  -Continue digoxin and metoprolol in the AM.   #Hypertension Normotensive while in the emergency department.  Patient actually has hypotension currently requiring midodrine in the outpatient setting. -We will resume his home midodrine when he returns from the OR  #COPD Continue home inhalers and PRN nebs   #T/AAA Evaluated by thoracic surgery recently no plans for intervention currently.    Dispo: Admit patient to Inpatient with expected length of stay greater than 2 midnights.  Signed: Rick Duff, MD 02/16/2022, 4:41 PM  After 5pm on weekdays and 1pm on weekends: On Call pager: [phone]

## 2022-02-16 NOTE — Progress Notes (Signed)
Patient discussed with ortho trauma PA.  Imaging reviewed: R pertroch proximal femur fx.  Will plan for surgical treatment at soonest availability in order to promote return to mobility.    - Admit to medicine - Preop risk stratification/clearance/optimization - NWB RLE - Medically appropriate analgesia - OR 02/16/2022 (posted) - Full consult to follow  Georgeanna Harrison M.D. Orthopaedic Surgery [facility]

## 2022-02-16 NOTE — Anesthesia Procedure Notes (Signed)
Anesthesia Regional Block: Peng block   Pre-Anesthetic Checklist: , timeout performed,  Correct Patient, Correct Site, Correct Laterality,  Correct Procedure, Correct Position, site marked,  Risks and benefits discussed,  Pre-op evaluation,  At surgeon's request and post-op pain management  Laterality: Right  Prep: Maximum Sterile Barrier Precautions used, chloraprep       Needles:  Injection technique: Single-shot  Needle Type: Echogenic Stimulator Needle     Needle Length: 9cm  Needle Gauge: 21     Additional Needles:   Procedures:,,,, ultrasound used (permanent image in chart),,    Narrative:  Start time: 02/16/2022 1:45 PM End time: 02/16/2022 1:55 PM Injection made incrementally with aspirations every 5 mL.  Performed by: Personally  Anesthesiologist: Roderic Palau, MD

## 2022-02-16 NOTE — Anesthesia Preprocedure Evaluation (Addendum)
Anesthesia Evaluation  Patient identified by MRN, date of birth, ID band Patient awake    Reviewed: Allergy & Precautions, H&P , NPO status , Patient's Chart, lab work & pertinent test results, reviewed documented beta blocker date and time   History of Anesthesia Complications Negative for: history of anesthetic complications  Airway Mallampati: I  TM Distance: >3 FB Neck ROM: Full    Dental  (+) Edentulous Upper, Edentulous Lower, Dental Advisory Given   Pulmonary COPD,  COPD inhaler, Current Smoker,    Pulmonary exam normal        Cardiovascular hypertension, Pt. on medications and Pt. on home beta blockers + dysrhythmias Atrial Fibrillation + Valvular Problems/Murmurs AI and MR  Rhythm:Irregular Rate:Normal  Echo 06/2021 Interpretation Summary The left ventricle is normal in size.There is mild concentric left ventricular hypertrophy.Left ventricular systolic function is normal.EF= 56% by 2D biplane method.Ejection Fraction = 55-60% (Visual Estimation).No regional wall motion abnormalities noted.Diastolic dysfunction, Grade II (pseudonormalization pattern). The right ventricle is normal in size and function. The left atrium is severely dilated.The right atrium is mildly dilated. The aortic valve is not well visualized.The aortic valve is not optimally visualized but appears trileaflet.Mild to moderate aortic regurgitation. 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 The tricuspid valve is grossly normal. There is mild tricuspid regurgitation.Right ventricular systolic pressure is estimated at 18 mmHg. Mildly dilated inferior vena cava The ascending aorta is dilated measuring 5.3 cm. Sinotubular ridge measures 3.8cm. Sinus of valsalva measures 3.6cm. There is no pericardial effusion.   Neuro/Psych negative neurological ROS  negative psych ROS   GI/Hepatic negative GI ROS, Neg liver ROS,    Endo/Other  diabetes  Renal/GU negative Renal ROS  negative genitourinary   Musculoskeletal   Abdominal   Peds  Hematology negative hematology ROS (+)   Anesthesia Other Findings   Reproductive/Obstetrics negative OB ROS                           Anesthesia Physical  Anesthesia Plan  ASA: 3  Anesthesia Plan:    Post-op Pain Management: Regional block* and Tylenol PO (pre-op)*   Induction:   PONV Risk Score and Plan: Treatment may vary due to age or medical condition  Airway Management Planned: Natural Airway  Additional Equipment: Arterial line  Intra-op Plan:   Post-operative Plan:   Informed Consent: I have reviewed the patients History and Physical, chart, labs and discussed the procedure including the risks, benefits and alternatives for the proposed anesthesia with the patient or authorized representative who has indicated his/her understanding and acceptance.     Dental advisory given  Plan Discussed with: Anesthesiologist and CRNA  Anesthesia Plan Comments:        Anesthesia Quick Evaluation

## 2022-02-16 NOTE — ED Triage Notes (Signed)
Pt was walking from mailbox to house when pt started getting dizzy. [address] and landed onto right hip against concrete ground. Right foot is rotated per EMS. Intact pulses to RLE. No LOC. Alert and oriented x 4.

## 2022-02-16 NOTE — ED Provider Notes (Signed)
[facility] EMERGENCY DEPARTMENT Provider Note   CSN: [id number] Arrival date & time: 02/16/22  1056     History {Add pertinent medical, surgical, social history, OB history to HPI:1} No chief complaint on file.   Phillip West is a 88 y.o. male with HTN, HLD, chronic A-fib, thoracic/abdominal aortic aneurysm, tobacco abuse, COPD, history of left femur fracture, T2DM, cardiomegaly presents with fall.   Patient reports that he became acutely lightheaded/dizzy at the mailbox and fell onto the ground, landing on his R hip. He has 5/10 pain of the R hip. Two years ago he fell in a similar situation and broke his L hip, required a replacement. He also became dizzy and fell a few weeks ago and ***.   HPI     Home Medications Prior to Admission medications   Medication Sig Start Date End Date Taking? Authorizing Provider  albuterol (VENTOLIN HFA) 108 (90 Base) MCG/ACT inhaler Inhale 1 puff into the lungs every 6 (six) hours as needed for wheezing or shortness of breath.    [provider]  Cholecalciferol (VITAMIN D) 50 MCG (2000 UT) tablet Take 1 tablet by mouth daily. 04/30/18   [provider]  digoxin (LANOXIN) 0.125 MG tablet Take 1 tablet (0.125 mg total) by mouth daily. 05/02/20   Dagar, Meredith Staggers, MD  LUBRICATING PLUS EYE DROPS 0.5 % SOLN Place 1 drop into both eyes daily. 06/01/18   [provider]  metoprolol tartrate (LOPRESSOR) 25 MG tablet Take 0.5 tablets (12.5 mg total) by mouth 2 (two) times daily. 05/01/20   Dagar, Meredith Staggers, MD  midodrine (PROAMATINE) 5 MG tablet Take 1 tablet (5 mg total) by mouth 3 (three) times daily with meals. 05/01/20   Dagar, Meredith Staggers, MD  pravastatin (PRAVACHOL) 40 MG tablet Take 1 tablet by mouth daily. 06/06/18   [provider]  SYMBICORT 160-4.5 MCG/ACT inhaler Inhale 2 puffs into the lungs 2 (two) times daily. 04/18/18   [provider]  tamsulosin (FLOMAX) 0.4 MG CAPS capsule Take 0.4 mg  by mouth daily. 04/24/18   [provider]      Allergies    Quinolones and Ciprofloxacin    Review of Systems   Review of Systems Review of systems positive/negative for ***.  A 10 point review of systems was performed and is negative unless otherwise reported in HPI.  Physical Exam Updated Vital Signs There were no vitals taken for this visit. Physical Exam General: Normal appearing ***male/male, lying in bed.  HEENT: PERRLA, Sclera anicteric, MMM, trachea midline. Cardiology: RRR, no murmurs/rubs/gallops. BL radial and DP pulses equal bilaterally.  Resp: Normal respiratory rate and effort. CTAB, no wheezes, rhonchi, crackles.  Abd: Soft, non-tender, non-distended. No rebound tenderness or guarding.  GU: Deferred. MSK: No peripheral edema or signs of trauma. Extremities without deformity or TTP. No cyanosis or clubbing. Skin: warm, dry. No rashes or lesions. Back: No CVA tenderness Neuro: A&Ox4, CNs II-XII grossly intact. MAEs. Sensation grossly intact.  Psych: Normal mood and affect.   ED Results / Procedures / Treatments   Labs (all labs ordered are listed, but only abnormal results are displayed) Labs Reviewed - No data to display  EKG None  Radiology No results found.  Procedures Procedures  {Document cardiac monitor, telemetry assessment procedure when appropriate:1}  Medications Ordered in ED Medications - No data to display  ED Course/ Medical Decision Making/ A&P  Medical Decision Making Amount and/or Complexity of Data Reviewed Labs: ordered. Radiology: ordered.    '@HNNMDM'$ @ ***  I have personally reviewed and interpreted all labs and imaging.      {Document critical care time when appropriate:1} {Document review of labs and clinical decision tools ie heart score, Chads2Vasc2 etc:1}  {Document your independent review of radiology images, and any outside records:1} {Document your discussion with family members,  caretakers, and with consultants:1} {Document social determinants of health affecting pt's care:1} {Document your decision making why or why not admission, treatments were needed:1} Final Clinical Impression(s) / ED Diagnoses Final diagnoses:  None    Rx / DC Orders ED Discharge Orders     None        This note was created using dictation software, which may contain spelling or grammatical errors.

## 2022-02-16 NOTE — Anesthesia Postprocedure Evaluation (Signed)
Anesthesia Post Note  Patient: Phillip West  Procedure(s) Performed: AN AD [place]     Patient location during evaluation: PACU Anesthesia Type: Regional Level of consciousness: awake and alert Pain management: pain level controlled Vital Signs Assessment: post-procedure vital signs reviewed and stable Respiratory status: spontaneous breathing, nonlabored ventilation, respiratory function stable and patient connected to nasal cannula oxygen Cardiovascular status: stable and blood pressure returned to baseline Postop Assessment: no apparent nausea or vomiting Anesthetic complications: no   No notable events documented.  Last Vitals:  Vitals:   02/16/22 1348 02/16/22 1413  BP: 124/72 94/63  Pulse: 64 64  Resp: 16 14  Temp: 36.4 C   SpO2: 94% 95%    Last Pain:  Vitals:   02/16/22 1413  TempSrc:   PainSc: 3                  [name],W. EDMOND

## 2022-02-16 NOTE — Consult Note (Signed)
Orthopaedic Consult  Date/Time: 02/16/22 7:12 PM  Patient Name: Phillip West  Attending Physician: No att. providers found    ASSESSMENT & PLAN  Orthopaedic Assessment: 88 y.o. male with right peritrochanteric fracture.  Plan: Explained to the patient that he has sustained a right pertrochanteric proximal femur fracture.  Both nonoperative and operative treatment options were discussed.  Nonoperative treatment would consist of prolonged immobilization with a delayed return to weightbearing.  This is associated with significant morbidity, including increased risk of pneumonia, UTI, pressure ulcers, DVT/VTE.  Given the patient's previous level of mobility and functional status, as well as desire to avoid complications and morbidity of nonoperative treatment, he/the family have elected to proceed with operative treatment with reduction and cephalomedullary nail fixation.  Risks of the proposed surgical treatment were discussed with the patient/family, including bleeding, wound healing complications, infection, damage to surrounding structures, persistent pain, stiffness, lack of improvement, potential for subsequent arthritis or worsening of pre-existing arthritis, nonunion, malunion, and need for further surgery, as well as complications related to anesthesia, cardiovascular complications, and death.  All questions were answered to the satisfaction of the patient/family.  He/the family understand(s) all of this and wishes to proceed with surgery.    Georgeanna Harrison M.D. Orthopaedic Surgery [facility]   Medical Decision Making  Amount/complexity of data: Is there a current pathologic fracture (e.g. neoplastic, osteoporotic insufficiency fracture)? Yes Independent interpretation of radiographic studies: Yes Review of radiology results (e.g. reports): Yes Tests ordered (e.g. additional radiographic studies, labs): Yes Lab results reviewed: Yes Reviewed old  records: Yes History from another source (independent historian, e.g. family/friend/etc.): Yes Discussion of imaging, clinical data, and or management with independent medical provider: Yes Risk: Patient receiving IV controlled substances for pain: Yes Fracture requiring manipulation: No Urgent or emergent (non-elective) surgery likely this admission: Yes Presence of medical comorbidities and/or surgical risk factors (e.g. current smoker, CAD, diabetes, COPD, CKD, etc.): Yes Closed fracture management WITHOUT manipulation: No Urgent minor procedure (e.g. joint aspiration, compartment pressure measurement, etc.): No Will likely need surgery as an outpatient: No     HPI Phillip West is a 88 y.o. male. Orthopaedic consultation has specifically been requested to address this patient's current musculoskeletal presentation. He sustained a fall and had immediate pain in the right hip/thigh.  Pain is sharp and severe.  Pain is worse movement and better with rest and immobilization.  Since the fall the patient was not able to ambulate or bear weight due to pain.  At baseline the patient is ambulatory with no assistive devices.  He denies knowledge of additional injuries.   PMH Past Medical History:  Diagnosis Date   Abdominal aortic aneurysm (AAA) (HCC)    Atrial fibrillation ([place])    [place] right lower eyelid    COPD (chronic obstructive pulmonary disease) (HCC)    Diabetes mellitus ([place])    Gross hematuria    Hypertension    Syncope and collapse    Thoracic aortic aneurysm (TAA) ([place])      PSH Past Surgical History:  Procedure Laterality Date   HERNIA REPAIR     INTRAMEDULLARY (IM) NAIL INTERTROCHANTERIC Left 04/26/2020   Procedure: INTRAMEDULLARY (IM) NAIL INTERTROCHANTRIC;  Surgeon: Erle Crocker, MD;  Location: [facility];  Service: Orthopedics;  Laterality: Left;   KNEE ARTHROSCOPY     Home Medications Prior to Admission medications   Medication Sig Start Date End Date  Taking? Authorizing Provider  albuterol (VENTOLIN HFA) 108 (90 Base) MCG/ACT inhaler  Inhale 1 puff into the lungs every 6 (six) hours as needed for wheezing or shortness of breath.   Yes [provider]  Cholecalciferol (VITAMIN D) 50 MCG (2000 UT) tablet Take 1 tablet by mouth daily. 04/30/18  Yes [provider]  digoxin (LANOXIN) 0.125 MG tablet Take 1 tablet (0.125 mg total) by mouth daily. 05/02/20  Yes Dagar, Meredith Staggers, MD  LUBRICATING PLUS EYE DROPS 0.5 % SOLN Place 1 drop into both eyes daily. 06/01/18  Yes [provider]  metoprolol tartrate (LOPRESSOR) 25 MG tablet Take 0.5 tablets (12.5 mg total) by mouth 2 (two) times daily. 05/01/20  Yes Dagar, Meredith Staggers, MD  midodrine (PROAMATINE) 5 MG tablet Take 1 tablet (5 mg total) by mouth 3 (three) times daily with meals. 05/01/20  Yes Dagar, Meredith Staggers, MD  pravastatin (PRAVACHOL) 40 MG tablet Take 1 tablet by mouth daily. 06/06/18  Yes [provider]  SYMBICORT 160-4.5 MCG/ACT inhaler Inhale 2 puffs into the lungs 2 (two) times daily. 04/18/18  Yes [provider]  tamsulosin (FLOMAX) 0.4 MG CAPS capsule Take 0.4 mg by mouth daily. 04/24/18  Yes [provider]     Allergies Allergies  Allergen Reactions   Quinolones Other (See Comments)    Patient was warned about not using Cipro and similar antibiotics. Recent studies have raised concern that fluoroquinolone antibiotics could be associated with an increased risk of aortic aneurysm Fluoroquinolones have non-antimicrobial properties that might jeopardise the integrity of the extracellular matrix of the vascular wall In a  propensity score matched cohort study in [place], there was a 66% increased rate of aortic aneurysm or dissection associated with oral fluoroquinolone use, compared wit   Ciprofloxacin Other (See Comments)    IRREGULAR HEART RATE     Family History Family History  Problem Relation Age of Onset   Heart disease Father    Stroke  Father     Social History Social History   Socioeconomic History   Marital status: Widowed    Spouse name: Not on file   Number of children: Not on file   Years of education: Not on file   Highest education level: Not on file  Occupational History   Occupation: retired  Tobacco Use   Smoking status: Every Day    Packs/day: 1.00    Types: Cigarettes    Start date: 1973   Smokeless tobacco: Never  Vaping Use   Vaping Use: Never used  Substance and Sexual Activity   Alcohol use: Not Currently   Drug use: Never   Sexual activity: Not Currently  Other Topics Concern   Not on file  Social History Narrative   Lives with son   Social Determinants of Health   Financial Resource Strain: Not on file  Food Insecurity: Not on file  Transportation Needs: Not on file  Physical Activity: Not on file  Stress: Not on file  Social Connections: Not on file  Intimate Partner Violence: Not on file     Review of Systems MSK: As noted per HPI above GI: No current Nausea/vomiting ENT: Denies sore throat, epistaxis CV: Denies chest pain  Resp: No current shortness of breath  Other than mentioned above, there are no Constitutional, Neurological, Psychiatric, ENT, Ophthalmological, Cardiovascular, Respiratory, GI, GU, Musculoskeletal, Integumentary, Lymphatic, Endocrine or Allergic issues.     Imaging  Independent interpretation of orthopaedic-relevant films: Multiple radiographic views of the pelvis, right hip, and right femur demonstrate comminuted right peritrochanteric proximal femur fracture.  Radiographic results: DG Femur  Min 2 Views Right  Result Date: 02/16/2022 CLINICAL DATA:  Fall earlier today landing on right side with right hip pain. EXAM: RIGHT FEMUR 2 VIEWS COMPARISON:  None Available. FINDINGS: Diffuse decreased bone mineralization. Evidence of patient's displaced slightly comminuted intertrochanteric fracture of the right proximal femur. Mild degenerative change of the  right hip and knee. IMPRESSION: Displaced slightly comminuted intertrochanteric fracture of the right proximal femur. Electronically Signed   By: Marin Olp M.D.   On: 02/16/2022 12:21   DG Pelvis 1-2 Views  Result Date: 02/16/2022 CLINICAL DATA:  Fall today landing on right side with right hip pain. EXAM: PELVIS - 1-2 VIEW COMPARISON:  04/25/2020 FINDINGS: There is a displaced slightly comminuted fracture of the intertrochanteric region of the right femur. Mild superolateral displacement of the predominant distal fragment. Mild bilateral degenerative changes of the hips. Partially visualized hardware over the left femur intact. There is diffuse decreased bone mineralization present. Mild degenerate change of the spine. IMPRESSION: Displaced slightly comminuted intertrochanteric fracture of the right femur. Electronically Signed   By: Marin Olp M.D.   On: 02/16/2022 12:16   Labs  Recent Labs    02/16/22 1215  WBC 8.5  HGB 12.9*  HCT 39.7  PLT 146*   Recent Labs    02/16/22 1215  NA 139  K 4.5  CL 106  CO2 26  BUN 9  CREATININE 0.89  GLUCOSE 117*  CALCIUM 9.4   Lab Results  Component Value Date   INR 1.1 04/25/2020        Physical Examination  Patient is a 88 y.o. year old male who is alert, well appearing, and in no distress, mood is calm.  Orientation: oriented to person, place, time, and general circumstances  Vital Signs: BP 109/68   Pulse 87   Temp 98.7 F (37.1 C) (Oral)   Resp 16   Ht '5\' 8"'$  (1.727 m)   Wt 68 kg   SpO2 96%   BMI 22.79 kg/m    Gait: Unable to ambulate with right hip injury.  Supine on stretcher.  Heart: Normal rate Lungs: Non-labored breathing Abdomen: Soft, Non-tender   Right Upper Extremity: Inspection: Atraumatic Palpation: Nontender ROM: Full, painless Strength: Normal Sensation: Intact to light touch distally Skin: Intact Peripheral Vascular: Well perfused Joint Stability: No instability Reflexes: No pathologic Lymph  Nodes: None Palpable Coordination: Intact, normal   Left Upper Extremity: Inspection: Atraumatic Palpation: Nontender ROM: Full, painless Strength: Normal Sensation: Intact to light touch distally Skin: Intact Peripheral Vascular: Well perfused Joint Stability: No instability Reflexes: No pathologic Lymph Nodes: None Palpable Coordination: Intact, normal    Right Lower Extremity: Inspection: Slightly shortened held in external rotation Palpation: Tender to palpation over hip at site of known peritrochanteric fracture ROM: Hip range of motion severely limited due to pain Strength: Intact dorsiflexion, plantarflexion, EHL Sensation: Intact light touch distally in superficial peroneal, deep peroneal, and tibial distributions Skin: Intact Peripheral Vascular: Normal DP pulse, warm well-perfused distally Joint Stability: No instability knee Reflexes: No pathologic Lymph Nodes: None Palpable Coordination: Unable to assess due to pain and injury   Left Lower Extremity: Inspection: Atraumatic Palpation: Nontender ROM: Full, painless Strength: Normal Sensation: Intact to light touch distally Skin: Intact Peripheral Vascular: Well perfused Joint Stability: No instability Reflexes: No pathologic Lymph Nodes: None Palpable Coordination: Intact, normal    Pelvis: Skin: Intact Palpation: Nontender Stability: No instability      The review of the patient's medications does not in any way constitute  an endorsement, by this clinician,  of their use, dosage, indications, route, efficacy, interactions, or other clinical parameters.  This note was generated within the EP[employer] EMR using Dragon medical speech recognition software and may contain inherent errors or omissions not intended by the user. Grammatical and punctuation errors, random word insertions, deletions, pronoun errors and incomplete sentences are occasional consequences of this technology due to software limitations. Not all  errors are caught or corrected.  Although every attempt is made to root out erroneus and incomplete transcription, the note may still not fully represent the intent or opinion of the author. If there are questions or concerns about the content of this note or information contained within the body of this dictation they should be addressed directly with the author for clarification.

## 2022-02-16 NOTE — Anesthesia Preprocedure Evaluation (Signed)
Anesthesia Evaluation  Patient identified by MRN, date of birth, ID band Patient awake    Reviewed: Allergy & Precautions, H&P , NPO status , Patient's Chart, lab work & pertinent test results, reviewed documented beta blocker date and time   Airway        Dental no notable dental hx.    Pulmonary COPD,  COPD inhaler, Current Smoker,    Pulmonary exam normal        Cardiovascular hypertension, Pt. on medications and Pt. on home beta blockers + dysrhythmias Atrial Fibrillation      Neuro/Psych negative neurological ROS  negative psych ROS   GI/Hepatic negative GI ROS, Neg liver ROS,   Endo/Other  diabetes  Renal/GU negative Renal ROS  negative genitourinary   Musculoskeletal   Abdominal   Peds  Hematology negative hematology ROS (+)   Anesthesia Other Findings   Reproductive/Obstetrics negative OB ROS                             Anesthesia Physical Anesthesia Plan  ASA: 3  Anesthesia Plan: Regional   Post-op Pain Management: Regional block*   Induction:   PONV Risk Score and Plan: 0 and Treatment may vary due to age or medical condition  Airway Management Planned: Natural Airway and Nasal Cannula  Additional Equipment:   Intra-op Plan:   Post-operative Plan:   Informed Consent: I have reviewed the patients History and Physical, chart, labs and discussed the procedure including the risks, benefits and alternatives for the proposed anesthesia with the patient or authorized representative who has indicated his/her understanding and acceptance.     Dental advisory given  Plan Discussed with: CRNA  Anesthesia Plan Comments:         Anesthesia Quick Evaluation

## 2022-02-16 NOTE — ED Notes (Signed)
Procedural consent for surgery (right cephalomedullary nail) completed, signed by pt and witnessed by this RN.

## 2022-02-17 ENCOUNTER — Encounter (HOSPITAL_COMMUNITY): Payer: Self-pay | Admitting: Internal Medicine

## 2022-02-17 ENCOUNTER — Other Ambulatory Visit: Payer: Self-pay

## 2022-02-17 DIAGNOSIS — R296 Repeated falls: Secondary | ICD-10-CM

## 2022-02-17 DIAGNOSIS — I482 Chronic atrial fibrillation, unspecified: Secondary | ICD-10-CM

## 2022-02-17 DIAGNOSIS — R42 Dizziness and giddiness: Secondary | ICD-10-CM

## 2022-02-17 DIAGNOSIS — S72001A Fracture of unspecified part of neck of right femur, initial encounter for closed fracture: Secondary | ICD-10-CM

## 2022-02-17 LAB — CBC
HCT: 31.6 % — ABNORMAL LOW (ref 39.0–52.0)
Hemoglobin: 10.6 g/dL — ABNORMAL LOW (ref 13.0–17.0)
MCH: 32.8 pg (ref 26.0–34.0)
MCHC: 33.5 g/dL (ref 30.0–36.0)
MCV: 97.8 fL (ref 80.0–100.0)
Platelets: 115 10*3/uL — ABNORMAL LOW (ref 150–400)
RBC: 3.23 MIL/uL — ABNORMAL LOW (ref 4.22–5.81)
RDW: 13.7 % (ref 11.5–15.5)
WBC: 9.4 10*3/uL (ref 4.0–10.5)
nRBC: 0 % (ref 0.0–0.2)

## 2022-02-17 LAB — COMPREHENSIVE METABOLIC PANEL
ALT: 13 U/L (ref 0–44)
AST: 25 U/L (ref 15–41)
Albumin: 3.6 g/dL (ref 3.5–5.0)
Alkaline Phosphatase: 41 U/L (ref 38–126)
Anion gap: 12 (ref 5–15)
BUN: 14 mg/dL (ref 8–23)
CO2: 22 mmol/L (ref 22–32)
Calcium: 9 mg/dL (ref 8.9–10.3)
Chloride: 104 mmol/L (ref 98–111)
Creatinine, Ser: 0.96 mg/dL (ref 0.61–1.24)
GFR, Estimated: >60 mL/min (ref >60)
Glucose, Bld: 139 mg/dL — ABNORMAL HIGH (ref 70–99)
Potassium: 3.7 mmol/L (ref 3.5–5.1)
Sodium: 138 mmol/L (ref 135–145)
Total Bilirubin: 1.6 mg/dL — ABNORMAL HIGH (ref 0.3–1.2)
Total Protein: 5.9 g/dL — ABNORMAL LOW (ref 6.5–8.1)

## 2022-02-17 LAB — VITAMIN D 25 HYDROXY (VIT D DEFICIENCY, FRACTURES): Vit D, 25-Hydroxy: 45.17 ng/mL (ref 30–100)

## 2022-02-17 MED ORDER — ONDANSETRON HCL 4 MG PO TABS: 4.0000 mg | ORAL_TABLET | Freq: Four times a day (QID) | ORAL | Status: DC | PRN

## 2022-02-17 MED ORDER — ONDANSETRON HCL 4 MG/2ML IJ SOLN: 4.0000 mg | Freq: Four times a day (QID) | INTRAMUSCULAR | Status: DC | PRN

## 2022-02-17 MED ORDER — ACETAMINOPHEN 500 MG PO TABS
500.0000 mg | ORAL_TABLET | Freq: Four times a day (QID) | ORAL | Status: AC
  Administered 2022-02-17 (×3): 500 mg via ORAL
  Filled 2022-02-17 (×3): qty 1

## 2022-02-17 MED ORDER — TRANEXAMIC ACID-NACL 1000-0.7 MG/100ML-% IV SOLN
1000.0000 mg | Freq: Once | INTRAVENOUS | Status: AC
  Administered 2022-02-17: 1000 mg via INTRAVENOUS
  Filled 2022-02-17: qty 100

## 2022-02-17 MED ORDER — MENTHOL 3 MG MT LOZG: 1.0000 | LOZENGE | OROMUCOSAL | Status: DC | PRN

## 2022-02-17 MED ORDER — PHENOL 1.4 % MT LIQD: 1.0000 | OROMUCOSAL | Status: DC | PRN

## 2022-02-17 MED ORDER — HYDROCODONE-ACETAMINOPHEN 5-325 MG PO TABS: 1.0000 | ORAL_TABLET | ORAL | Status: DC | PRN

## 2022-02-17 MED ORDER — DOCUSATE SODIUM 100 MG PO CAPS
100.0000 mg | ORAL_CAPSULE | Freq: Two times a day (BID) | ORAL | Status: DC
  Administered 2022-02-17 – 2022-02-23 (×8): 100 mg via ORAL
  Filled 2022-02-17 (×12): qty 1

## 2022-02-17 MED ORDER — DIGOXIN 125 MCG PO TABS
0.1250 mg | ORAL_TABLET | Freq: Every day | ORAL | Status: DC
  Administered 2022-02-17 – 2022-02-20 (×4): 0.125 mg via ORAL
  Filled 2022-02-17 (×4): qty 1

## 2022-02-17 MED ORDER — CEFAZOLIN SODIUM-DEXTROSE 2-4 GM/100ML-% IV SOLN
2.0000 g | Freq: Four times a day (QID) | INTRAVENOUS | Status: AC
  Administered 2022-02-17 (×2): 2 g via INTRAVENOUS
  Filled 2022-02-17 (×2): qty 100

## 2022-02-17 MED ORDER — FLUTICASONE FUROATE-VILANTEROL 200-25 MCG/ACT IN AEPB
1.0000 | INHALATION_SPRAY | Freq: Every day | RESPIRATORY_TRACT | Status: DC
  Administered 2022-02-17 – 2022-02-24 (×7): 1 via RESPIRATORY_TRACT
  Filled 2022-02-17: qty 28

## 2022-02-17 MED ORDER — METOPROLOL TARTRATE 12.5 MG HALF TABLET: 12.5000 mg | ORAL_TABLET | Freq: Two times a day (BID) | ORAL | Status: DC

## 2022-02-17 MED ORDER — MIDODRINE HCL 5 MG PO TABS
5.0000 mg | ORAL_TABLET | Freq: Three times a day (TID) | ORAL | Status: DC
  Administered 2022-02-17 – 2022-02-24 (×22): 5 mg via ORAL
  Filled 2022-02-17 (×22): qty 1

## 2022-02-17 MED ORDER — ACETAMINOPHEN 325 MG PO TABS: 325.0000 mg | ORAL_TABLET | Freq: Four times a day (QID) | ORAL | Status: DC | PRN

## 2022-02-17 MED ORDER — HYDROCODONE-ACETAMINOPHEN 7.5-325 MG PO TABS
1.0000 | ORAL_TABLET | ORAL | Status: DC | PRN
  Administered 2022-02-17 – 2022-02-19 (×2): 1 via ORAL
  Filled 2022-02-17 (×2): qty 1

## 2022-02-17 MED ORDER — ENOXAPARIN SODIUM 40 MG/0.4ML IJ SOSY
40.0000 mg | PREFILLED_SYRINGE | INTRAMUSCULAR | Status: DC
  Administered 2022-02-17 – 2022-02-18 (×2): 40 mg via SUBCUTANEOUS
  Filled 2022-02-17 (×2): qty 0.4

## 2022-02-17 MED ORDER — PRAVASTATIN SODIUM 40 MG PO TABS
40.0000 mg | ORAL_TABLET | Freq: Every day | ORAL | Status: DC
  Administered 2022-02-17 – 2022-02-23 (×7): 40 mg via ORAL
  Filled 2022-02-17 (×7): qty 1

## 2022-02-17 MED ORDER — MORPHINE SULFATE (PF) 2 MG/ML IV SOLN: 0.5000 mg | INTRAVENOUS | Status: DC | PRN

## 2022-02-17 NOTE — Evaluation (Signed)
Physical Therapy Evaluation Patient Details Name: Phillip West MRN: [id number] DOB: April 01, 1930 Today's Date: 02/17/2022  History of Present Illness  88 y/o male presented to ED on 02/16/22 after fall at mailbox. Sustained R peritrochanteric fx. S/p R femur IM nail on 10/3. PMH includes abdominal aortic aneurysms, COPD, chronic Afib, T2DM, h/o L femur fx 2021  Clinical Impression  Patient admitted with the above. PTA, patient lives with son and reports independence with no AD. Patient with recent R wrist fx in 12/2021 and reports he is able to bear weight through wrist at this time. Patient presents with weakness, impaired balance, decreased activity tolerance, and pain. Patient required modA for bed mobility but once in sitting, reports excruciating pain and returning quickly to supine then refuses all further attempts at mobility. Patient states he has a daily battle with dizziness. Patient will benefit from skilled PT services during acute stay to address listed deficits. Recommend AIR at discharge to maximize functional mobility and return to PLOF.        Recommendations for follow up therapy are one component of a multi-disciplinary discharge planning process, led by the attending physician.  Recommendations may be updated based on patient status, additional functional criteria and insurance authorization.  Follow Up Recommendations Acute inpatient rehab (3hours/day) (hopeful to progress to home)      Assistance Recommended at Discharge Frequent or constant Supervision/Assistance  Patient can return home with the following  A lot of help with walking and/or transfers;A lot of help with bathing/dressing/bathroom;Assistance with cooking/housework;Assist for transportation;Help with stairs or ramp for entrance    Equipment Recommendations Rolling Quintessa Simmerman (2 wheels);BSC/3in1  Recommendations for Other Services  OT consult;Rehab consult    Functional Status Assessment Patient has had a recent  decline in their functional status and demonstrates the ability to make significant improvements in function in a reasonable and predictable amount of time.     Precautions / Restrictions Precautions Precautions: Fall Precaution Comments: dizziness, R wrist fx (12/2021) - patient states able to bear weight through wrist Restrictions Weight Bearing Restrictions: No      Mobility  Bed Mobility Overal bed mobility: Needs Assistance Bed Mobility: Supine to Sit, Sit to Supine     Supine to sit: Mod assist Sit to supine: Min assist   General bed mobility comments: cues for sequencing to achieve EOB. Assist for R LE management and trunk elevation. Once in sitting, patient with excruciating pain and returning to supine with minA for LE management. Refusing further mobility due to pain    Transfers                   General transfer comment: unable    Ambulation/Gait                  Stairs            Wheelchair Mobility    Modified Rankin (Stroke Patients Only)       Balance                                             Pertinent Vitals/Pain Pain Assessment Pain Assessment: Faces Faces Pain Scale: Hurts even more Pain Location: R hip Pain Descriptors / Indicators: Grimacing, Discomfort, Guarding Pain Intervention(s): Monitored during session, Repositioned, Limited activity within patient's tolerance    Home Living Family/patient expects to be discharged to:: Private residence  Living Arrangements: Children Available Help at Discharge: Family Type of Home: House Home Access: Stairs to enter Entrance Stairs-Rails: Right Entrance Stairs-Number of Steps: 4   Home Layout: One level Home Equipment: Shower seat;Grab bars - tub/shower      Prior Function Prior Level of Function : Independent/Modified Independent;History of Falls (last six months)             Mobility Comments: ambulates with no AD, driving. Recent fall 12/2021  resulting in L wrist fx. Patient states he is able to bear weight through wrist       Hand Dominance        Extremity/Trunk Assessment   Upper Extremity Assessment Upper Extremity Assessment: Defer to OT evaluation    Lower Extremity Assessment Lower Extremity Assessment: RLE deficits/detail RLE Deficits / Details: deficits consistent with post op pain and weakness       Communication      Cognition Arousal/Alertness: Awake/alert Behavior During Therapy: WFL for tasks assessed/performed Overall Cognitive Status: Within Functional Limits for tasks assessed                                          General Comments General comments (skin integrity, edema, etc.): On 2L O2 [name], spO2 94% throughout    Exercises     Assessment/Plan    PT Assessment Patient needs continued PT services  PT Problem List Decreased strength;Decreased activity tolerance;Decreased balance;Decreased mobility;Decreased knowledge of use of DME;Decreased safety awareness;Decreased knowledge of precautions       PT Treatment Interventions DME instruction;Gait training;Stair training;Functional mobility training;Therapeutic activities;Balance training;Therapeutic exercise;Patient/family education    PT Goals (Current goals can be found in the Care Plan section)  Acute Rehab PT Goals Patient Stated Goal: to get stronger and go home PT Goal Formulation: With patient Time For Goal Achievement: 03/03/22 Potential to Achieve Goals: Good    Frequency Min 4X/week     Co-evaluation               AM-PAC PT "6 Clicks" Mobility  Outcome Measure Help needed turning from your back to your side while in a flat bed without using bedrails?: A Lot Help needed moving from lying on your back to sitting on the side of a flat bed without using bedrails?: A Lot Help needed moving to and from a bed to a chair (including a wheelchair)?: A Lot Help needed standing up from a chair using your arms  (e.g., wheelchair or bedside chair)?: A Lot Help needed to walk in hospital room?: A Lot Help needed climbing 3-5 steps with a railing? : Total 6 Click Score: 11    End of Session Equipment Utilized During Treatment: Oxygen Activity Tolerance: Patient limited by pain Patient left: in bed;with call bell/phone within reach;with bed alarm set Nurse Communication: Mobility status PT Visit Diagnosis: Muscle weakness (generalized) (M62.81);Unsteadiness on feet (R26.81);Difficulty in walking, not elsewhere classified (R26.2);History of falling (Z91.81)    Time: 1017-5102 PT Time Calculation (min) (ACUTE ONLY): 27 min   Charges:   PT Evaluation $PT Eval Moderate Complexity: 1 Mod PT Treatments $Therapeutic Activity: 8-22 mins        Rosemary Pentecost A. Gilford Rile PT, DPT Acute Rehabilitation Services Office [phone]   Linna Hoff 02/17/2022, 5:06 PM

## 2022-02-17 NOTE — Progress Notes (Signed)
HD#1 Subjective:   Summary: [name] is a 88 yo M with a PMH of HTN, HLD, chronic atrial fibrillation, thoracic/abdominal aortic aneurysm (ascending aorta 5.9cm) c/b moderate aortic valve insufficiency, tobacco use disorder, COPD, h/o L femur fracture 2021, and T2DM.   Overnight Events: NAEO.  Patient states his last dizziness episode lasted about an hour and a half.  He has a history of recurrent falls.  States he will often walk to the mailbox up the hill from his yard and when he turns he will fall.  He tries to stay in the grass so he does not sustain any injuries.  He denies any prodromal symptoms and states sometimes he will just blackout.  Objective:  Vital signs in last 24 hours: Vitals:   02/17/22 0647 02/17/22 0800 02/17/22 0859 02/17/22 0900  BP: 118/66 117/74    Pulse: 90 100 100   Resp: '17 16 16   '$ Temp: 97.9 F (36.6 C) 98.1 F (36.7 C)    TempSrc: Oral Oral    SpO2: 96% 96% 100% 100%  Weight:      Height:       Supplemental O2: Nasal Cannula SpO2: 100 % O2 Flow Rate (L/min): 2 L/min   Physical Exam:  Constitutional: well-appearing elderly male sitting in hospital bed, in no acute distress HENT: normocephalic atraumatic, mucous membranes moist Eyes: conjunctiva non-erythematous Neck: supple Cardiovascular: regular rate and irregular rhythm, no m/r/g Pulmonary/Chest: normal work of breathing on room air, lungs clear to auscultation bilaterally Neurological: alert & oriented x 3.  Head impulse test abnormal.  No skew deviation. Skin: warm and dry Psych: Normal mood and affect  Filed Weights   02/16/22 1059  Weight: 149 lb 14.6 oz (68 kg)     Intake/Output Summary (Last 24 hours) at 02/17/2022 1622 Last data filed at 02/17/2022 1300 Gross per 24 hour  Intake 1050 ml  Output 900 ml  Net 150 ml   Net IO Since Admission: 150 mL [02/17/22 1622]  Pertinent Labs:    Latest Ref Rng & Units 02/17/2022    2:41 AM 02/16/2022   12:15 PM 04/30/2020     4:06 AM  CBC  WBC 4.0 - 10.5 K/uL 9.4  8.5  8.6   Hemoglobin 13.0 - 17.0 g/dL 10.6  12.9  11.7   Hematocrit 39.0 - 52.0 % 31.6  39.7  35.5   Platelets 150 - 400 K/uL 115  146  143        Latest Ref Rng & Units 02/17/2022    2:41 AM 02/16/2022   12:15 PM 05/01/2020    3:53 AM  CMP  Glucose 70 - 99 mg/dL 139  117  131   BUN 8 - 23 mg/dL '14  9  11   '$ Creatinine 0.61 - 1.24 mg/dL 0.96  0.89  0.73   Sodium 135 - 145 mmol/L 138  139  137   Potassium 3.5 - 5.1 mmol/L 3.7  4.5  3.8   Chloride 98 - 111 mmol/L 104  106  103   CO2 22 - 32 mmol/L '22  26  24   '$ Calcium 8.9 - 10.3 mg/dL 9.0  9.4  9.4   Total Protein 6.5 - 8.1 g/dL 5.9  6.2    Total Bilirubin 0.3 - 1.2 mg/dL 1.6  1.0    Alkaline Phos 38 - 126 U/L 41  52    AST 15 - 41 U/L 25  19    ALT 0 -  44 U/L 13  14      Imaging: DG FEMUR, MIN 2 VIEWS RIGHT  Result Date: 02/16/2022 CLINICAL DATA:  Right femoral fracture EXAM: RIGHT FEMUR 2 VIEWS COMPARISON:  Films from earlier in the same day. FLUOROSCOPY TIME:  Radiation Exposure Index (as provided by the fluoroscopic device): 6.51 mGy If the device does not provide the exposure index: Fluoroscopy Time:  57 seconds Number of Acquired Images:  4 FINDINGS: Medullary rod is noted in the proximal right femur. Fixation screws as well as a screw traversing the right femoral neck are noted. Fracture fragments are in near anatomic alignment. No other focal abnormality is noted. IMPRESSION: ORIF of proximal right femoral fracture. Electronically Signed   By: Inez Catalina M.D.   On: 02/16/2022 21:13   DG C-Arm 1-60 Min-No Report  Result Date: 02/16/2022 Fluoroscopy was utilized by the requesting physician.  No radiographic interpretation.    Assessment/Plan:   Principal Problem:   Hip fracture (HCC) Active Problems:   Atrial fibrillation, chronic (HCC)   Orthostatic hypotension   Dizziness   Recurrent falls   Patient Summary: [name] is a 88 yo M with a PMH of HTN, HLD,  chronic atrial fibrillation, thoracic/abdominal aortic aneurysm (ascending aorta 5.9cm) c/b moderate aortic valve insufficiency, tobacco use disorder, COPD, h/o L femur fracture 2021, and T2DM who presented after a fall in the setting of vertiginous symptoms found to have R comminuted intertrochanteric proximal femur fracture.   #R comminuted intertrochanteric proximal femur fracture  Patient had a fall after becoming dizzy.  He sustained a right femur fracture.  Now s/p open treatment right intertrochanteric fracture with cephalomedullary nail. -Vitamin D levels normal, calcium within normal levels -May need outpatient DEXA scan  -Pain control -PT/OT  #Fall #Vertiginous symptoms  After discussing symptoms further, feel that his vertiginous symptoms are most likely the result of orthostatics versus permanent A-fib.  Will hold off on MRI for now.  Ordered vestibular PT. HINTS exam suggests peripheral cause.  Orthostatics do not indicate orthostatic hypotension. -PT/OT   #Permanent atrial fibrillation  Patient in atrial fibrillation on admission.  Normotensive. He is currently on digoxin as well as metoprolol tartrate for this.  Anticoagulation is precluded by his falls. CHADSvasc score 3. HAS Bled score 2.  -Restarted home digoxin and metoprolol   #Hypertension Normotensive while in the emergency department.  Patient actually has hypotension currently requiring midodrine in the outpatient setting. -Midodrine 5 mg 3 times daily   #COPD Continue home inhalers and PRN nebs    #T/AAA Evaluated by thoracic surgery recently no plans for intervention currently.  Diet: Normal IVF: None,None VTE: Enoxaparin Code: Full PT/OT recs: Pending. TOC recs: Resources not needed at this time.  Dispo: Anticipated discharge to [place] MD Internal Medicine Resident PGY-1 Please contact the on call pager after 5 pm and on weekends at [phone].

## 2022-02-17 NOTE — TOC CAGE-AID Note (Addendum)
Transition of Care [facility]) - CAGE-AID Screening   Patient Details  Name: Phillip West MRN: [id number] Date of Birth: 09/15/1929  Transition of Care [facility]) CM/SW Contact:    Coralee Pesa, [place] Phone Number: 02/17/2022, 11:41 AM   Clinical Narrative: CSW met with pt at bedside to complete CAGE- AID assessment. Pt answered no to all questions and declined any history of SU. Resources not needed at this time.   CAGE-AID Screening:    Have You Ever Felt You Ought to Cut Down on Your Drinking or Drug Use?: No Have People Annoyed You By Critizing Your Drinking Or Drug Use?: No Have You Felt Bad Or Guilty About Your Drinking Or Drug Use?: No Have You Ever Had a Drink or Used Drugs First Thing In The Morning to Steady Your Nerves or to Get Rid of a Hangover?: No CAGE-AID Score: 0  Substance Abuse Education Offered: No

## 2022-02-17 NOTE — Social Work (Signed)
[facility]. Pt's caseworker is UYWXIPP [phone] ext [phone]. Pt's primary MD is Dr. Sherral Hammers and pt is seen at the [facility].

## 2022-02-17 NOTE — Progress Notes (Signed)
Orthopaedics Daily Progress Note   02/17/2022   9:09 AM  Phillip West is a 88 y.o. male 1 Day Post-Op s/p INTRAMEDULLARY (IM) NAIL INTERTROCHANTERIC  Subjective Pain well controlled.  Denies nausea, vomiting, or fevers. No PT yet.  Objective Vitals:   02/17/22 0859 02/17/22 0900  BP:    Pulse: 100   Resp: 16   Temp:    SpO2: 100% 100%    Intake/Output Summary (Last 24 hours) at 02/17/2022 9622 Last data filed at 02/17/2022 2979 Gross per 24 hour  Intake 1050 ml  Output 400 ml  Net 650 ml    Physical Exam RLE: Dressing clean, dry, and intact +DF/PF/EHL SILT SP/DP/T +DP/PT and WWP distally  Assessment 88 y.o. male s/p Procedure(s) (LRB): INTRAMEDULLARY (IM) NAIL INTERTROCHANTERIC (Right)  Plan Mobility: Out of bed with PT/OT Pain control: Continue to wean/titrate to appropriate oral regimen DVT Prophylaxis: Lovenox 40 mg daily for 6 weeks postoperatively Further surgical plans: None RLE: Weightbearing as tolerated, no restrictions, including no range of motion restrictions Dressing care: Keep AQUACEL on and dry for up to 14 days.  Do not allow surgical area to get wet before that.  Remove AQUACEL dressing after 14 days and allow area to get wet in shower but DO NOT SUBMERGE until wound is evaluated in clinic.  In most cases skin glue is used and no additional dressing is necessary.  Disposition: Per primary team as medically appropriate Follow-up: Please call [place] [phone]) to schedule follow-op appointment for 2 weeks after surgery.  The patient's postoperative prescriptions (e.g. pain medication, anticoagulation) have been printed, signed, and placed in/on the patient's hard chart: Lovenox and Norco   I have verified that my discharge instructions and follow-up information have been entered in the Discharge Navigator in [place].  These should automatically populate in the AVS.  Please print the AVS in its entirety and ensure  that the patient or a responsible party has a complete copy of the AVS before they are discharged.  If there are questions regarding discharge instructions or follow-up before the AVS is generated, please check the Discharge Navigator before attempting to contact the surgeon/office.  If unsure how to access the Discharge Navigator or the information contained in the Discharge Navigator, or how to generate/print the AVS, please contact the appropriate Nurse, learning disability.    Georgeanna Harrison M.D. Orthopaedic Surgery [facility]

## 2022-02-17 NOTE — Progress Notes (Signed)
   Inpatient Rehab Admissions Coordinator :  Per therapy recommendations patient was screened for CIR candidacy by Nickolai Rinks RN MSN. Patient is not yet at a level to tolerate the intensity required to pursue a CIR admit . Patient may have the potential to progress to become a candidate. The CIR admissions team will follow and monitor for progress and place a Rehab Consult order if felt to be appropriate. Please contact me with any questions.  Shiva Karis RN MSN Admissions Coordinator [phone]  

## 2022-02-18 DIAGNOSIS — S72001A Fracture of unspecified part of neck of right femur, initial encounter for closed fracture: Secondary | ICD-10-CM | POA: Diagnosis not present

## 2022-02-18 LAB — CBC
HCT: 29.5 % — ABNORMAL LOW (ref 39.0–52.0)
Hemoglobin: 10.3 g/dL — ABNORMAL LOW (ref 13.0–17.0)
MCH: 33.6 pg (ref 26.0–34.0)
MCHC: 34.9 g/dL (ref 30.0–36.0)
MCV: 96.1 fL (ref 80.0–100.0)
Platelets: 107 10*3/uL — ABNORMAL LOW (ref 150–400)
RBC: 3.07 MIL/uL — ABNORMAL LOW (ref 4.22–5.81)
RDW: 13.8 % (ref 11.5–15.5)
WBC: 9.7 10*3/uL (ref 4.0–10.5)
nRBC: 0 % (ref 0.0–0.2)

## 2022-02-18 LAB — COMPREHENSIVE METABOLIC PANEL
ALT: 12 U/L (ref 0–44)
AST: 28 U/L (ref 15–41)
Albumin: 3.3 g/dL — ABNORMAL LOW (ref 3.5–5.0)
Alkaline Phosphatase: 38 U/L (ref 38–126)
Anion gap: 9 (ref 5–15)
BUN: 12 mg/dL (ref 8–23)
CO2: 21 mmol/L — ABNORMAL LOW (ref 22–32)
Calcium: 9.1 mg/dL (ref 8.9–10.3)
Chloride: 106 mmol/L (ref 98–111)
Creatinine, Ser: 0.73 mg/dL (ref 0.61–1.24)
GFR, Estimated: >60 mL/min (ref >60)
Glucose, Bld: 117 mg/dL — ABNORMAL HIGH (ref 70–99)
Potassium: 3.4 mmol/L — ABNORMAL LOW (ref 3.5–5.1)
Sodium: 136 mmol/L (ref 135–145)
Total Bilirubin: 1.2 mg/dL (ref 0.3–1.2)
Total Protein: 5.7 g/dL — ABNORMAL LOW (ref 6.5–8.1)

## 2022-02-18 LAB — MAGNESIUM: Magnesium: 1.8 mg/dL (ref 1.7–2.4)

## 2022-02-18 MED ORDER — POTASSIUM CHLORIDE 20 MEQ PO PACK
40.0000 meq | PACK | Freq: Once | ORAL | Status: AC
  Administered 2022-02-18: 40 meq via ORAL
  Filled 2022-02-18: qty 2

## 2022-02-18 MED ORDER — RIVAROXABAN 10 MG PO TABS
10.0000 mg | ORAL_TABLET | Freq: Every day | ORAL | Status: DC
  Filled 2022-02-18: qty 1

## 2022-02-18 MED ORDER — RIVAROXABAN 10 MG PO TABS: 10.0000 mg | ORAL_TABLET | Freq: Every day | ORAL | Status: DC

## 2022-02-18 MED ORDER — RIVAROXABAN 10 MG PO TABS
10.0000 mg | ORAL_TABLET | Freq: Every day | ORAL | Status: DC
  Administered 2022-02-19 – 2022-02-22 (×4): 10 mg via ORAL
  Filled 2022-02-18 (×5): qty 1

## 2022-02-18 NOTE — Progress Notes (Signed)
ANTICOAGULATION CONSULT NOTE - Initial Consult  Pharmacy Consult for Xarelto Indication: VTE prophylaxis  Allergies  Allergen Reactions   Quinolones Other (See Comments)    Patient was warned about not using Cipro and similar antibiotics. Recent studies have raised concern that fluoroquinolone antibiotics could be associated with an increased risk of aortic aneurysm Fluoroquinolones have non-antimicrobial properties that might jeopardise the integrity of the extracellular matrix of the vascular wall In a  propensity score matched cohort study in [place], there was a 66% increased rate of aortic aneurysm or dissection associated with oral fluoroquinolone use, compared wit   Ciprofloxacin Other (See Comments)    IRREGULAR HEART RATE    Patient Measurements: Height: '5\' 8"'$  (172.7 cm) Weight: 68 kg (149 lb 14.6 oz) IBW/kg (Calculated) : 68.4 Heparin Dosing Weight:   Vital Signs: Temp: 97.9 F (36.6 C) (10/05 1547) Temp Source: Oral (10/05 1547) BP: 114/72 (10/05 1547) Pulse Rate: 105 (10/05 1547)  Labs: Recent Labs    02/16/22 1215 02/17/22 0241 02/18/22 0220  HGB 12.9* 10.6* 10.3*  HCT 39.7 31.6* 29.5*  PLT 146* 115* 107*  CREATININE 0.89 0.96 0.73    Estimated Creatinine Clearance: 59 mL/min (by C-G formula based on SCr of 0.73 mg/dL).   Medical History: Past Medical History:  Diagnosis Date   Abdominal aortic aneurysm (AAA) ([place])    Atrial fibrillation ([place])    Chalazion right lower eyelid    COPD (chronic obstructive pulmonary disease) (HCC)    Diabetes mellitus ([place])    Gross hematuria    Hypertension    Syncope and collapse    Thoracic aortic aneurysm (TAA) (HCC)     Medications:  Medications Prior to Admission  Medication Sig Dispense Refill Last Dose   albuterol (VENTOLIN HFA) 108 (90 Base) MCG/ACT inhaler Inhale 1 puff into the lungs every 6 (six) hours as needed for wheezing or shortness of breath.   unk   Cholecalciferol (VITAMIN D) 50 MCG (2000 UT)  tablet Take 1 tablet by mouth daily.   02/16/2022   digoxin (LANOXIN) 0.125 MG tablet Take 1 tablet (0.125 mg total) by mouth daily. 30 tablet 0 02/16/2022   LUBRICATING PLUS EYE DROPS 0.5 % SOLN Place 1 drop into both eyes daily.   02/16/2022   metoprolol tartrate (LOPRESSOR) 25 MG tablet Take 0.5 tablets (12.5 mg total) by mouth 2 (two) times daily. 30 tablet 0 02/16/2022 at 0730   midodrine (PROAMATINE) 5 MG tablet Take 1 tablet (5 mg total) by mouth 3 (three) times daily with meals. 90 tablet 0 02/16/2022   pravastatin (PRAVACHOL) 40 MG tablet Take 1 tablet by mouth daily.   02/16/2022   SYMBICORT 160-4.5 MCG/ACT inhaler Inhale 2 puffs into the lungs 2 (two) times daily.   02/16/2022   tamsulosin (FLOMAX) 0.4 MG CAPS capsule Take 0.4 mg by mouth daily.   02/16/2022   Scheduled:   digoxin  0.125 mg Oral Daily   docusate sodium  100 mg Oral BID   fluticasone furoate-vilanterol  1 puff Inhalation Daily   midodrine  5 mg Oral TID WC   pravastatin  40 mg Oral QHS   rivaroxaban  10 mg Oral Q supper   Infusions:   Assessment: Pt with a hx of afib but not on anticoagulation due to a history of falls. Xarelto ordered for VTE prophylaxis here.    Plan:  Xarelto '10mg'$  PO qday Rx will follow peripherally  Onnie Boer, PharmD, BCIDP, AAHIVP, CPP Infectious Disease Pharmacist 02/18/2022 6:32 PM

## 2022-02-18 NOTE — Progress Notes (Signed)
Physical Therapy Treatment Patient Details Name: Phillip West MRN: [id number] DOB: [birth year] Today's Date: 02/18/2022   History of Present Illness 88 y/o male presented to ED on 02/16/22 after fall at mailbox. Sustained R peritrochanteric fx. S/p R femur IM nail on 10/3. PMH includes abdominal aortic aneurysms, COPD, chronic Afib, T2DM, h/o L femur fx 2021    PT Comments    Pt admitted with above diagnosis. Pt was able to stand and pivot to recliner with mod assist of 2 persons. Pt took max encouragement to work with therapy. Pt may need SNF for therapy to incr time for recovery. Pt did have dizziness but would not cooperate for PT to assess vertigo.  Pt very directive about he will and wont do.  Will follow acutely.  Pt currently with functional limitations due to balance and endurance deficits. Pt will benefit from skilled PT to increase their independence and safety with mobility to allow discharge to the venue listed below.      Recommendations for follow up therapy are one component of a multi-disciplinary discharge planning process, led by the attending physician.  Recommendations may be updated based on patient status, additional functional criteria and insurance authorization.  Follow Up Recommendations  Skilled nursing-short term rehab (<3 hours/day) Can patient physically be transported by private vehicle: No   Assistance Recommended at Discharge Frequent or constant Supervision/Assistance  Patient can return home with the following A lot of help with walking and/or transfers;A lot of help with bathing/dressing/bathroom;Assistance with cooking/housework;Assist for transportation;Help with stairs or ramp for entrance   Equipment Recommendations  Rolling walker (2 wheels);BSC/3in1    Recommendations for Other Services OT consult;Rehab consult     Precautions / Restrictions Precautions Precautions: Fall Precaution Comments: dizziness, R wrist fx (12/2021) - patient states able  to bear weight through wrist Restrictions Weight Bearing Restrictions: No     Mobility  Bed Mobility Overal bed mobility: Needs Assistance Bed Mobility: Rolling Rolling: Mod assist   Supine to sit: Mod assist, +2 for physical assistance     General bed mobility comments: Pt needed incr assist for right LE as well as for elevation of trunk with intiial lean backwards. Took incr time for pt to get balacne at EOB.    Transfers Overall transfer level: Needs assistance Equipment used: Rolling walker (2 wheels) Transfers: Sit to/from Stand, Bed to chair/wheelchair/BSC Sit to Stand: Mod assist, +2 physical assistance, From elevated surface   Step pivot transfers: Mod assist, +2 physical assistance       General transfer comment: Pt was able to stand with cues fro hand placement and asssit to pwer up.  Once up took pivotal steps around with mod cues and assist to move RW and sequence steps.    Ambulation/Gait                   Stairs             Wheelchair Mobility    Modified Rankin (Stroke Patients Only)       Balance                                            Cognition Arousal/Alertness: Awake/alert Behavior During Therapy: WFL for tasks assessed/performed Overall Cognitive Status: Within Functional Limits for tasks assessed  Exercises General Exercises - Lower Extremity Ankle Circles/Pumps: AROM, Both, 10 reps, Supine Quad Sets: AROM, Both, 10 reps, Supine Long Arc Quad: AROM, Both, 10 reps, Seated Heel Slides: AAROM, Right, 10 reps, Supine    General Comments General comments (skin integrity, edema, etc.): On 2LO2 with sats >90%.      Pertinent Vitals/Pain Pain Assessment Pain Assessment: Faces Faces Pain Scale: Hurts whole lot Pain Location: R hip Pain Descriptors / Indicators: Grimacing, Discomfort, Guarding Pain Intervention(s): Limited activity within  patient's tolerance, Monitored during session, Repositioned    Home Living                          Prior Function            PT Goals (current goals can now be found in the care plan section) Acute Rehab PT Goals Patient Stated Goal: to get stronger and go home Progress towards PT goals: Progressing toward goals    Frequency    Min 4X/week      PT Plan Discharge plan needs to be updated    Co-evaluation              AM-PAC PT "6 Clicks" Mobility   Outcome Measure  Help needed turning from your back to your side while in a flat bed without using bedrails?: A Lot Help needed moving from lying on your back to sitting on the side of a flat bed without using bedrails?: A Lot Help needed moving to and from a bed to a chair (including a wheelchair)?: Total Help needed standing up from a chair using your arms (e.g., wheelchair or bedside chair)?: Total Help needed to walk in hospital room?: Total Help needed climbing 3-5 steps with a railing? : Total 6 Click Score: 8    End of Session Equipment Utilized During Treatment: Oxygen;Gait belt Activity Tolerance: Patient limited by pain;Patient limited by fatigue Patient left: with call bell/phone within reach;in chair;with chair alarm set Nurse Communication: Mobility status PT Visit Diagnosis: Muscle weakness (generalized) (M62.81);Unsteadiness on feet (R26.81);Difficulty in walking, not elsewhere classified (R26.2);History of falling (Z91.81)     Time: 3354-5625 PT Time Calculation (min) (ACUTE ONLY): 19 min  Charges:  $Therapeutic Activity: 8-22 mins                     [facility] M,PT Acute Rehab Services [phone]    Alvira Philips 02/18/2022, 2:22 PM

## 2022-02-18 NOTE — Progress Notes (Addendum)
HD#2 Subjective:   Summary: [name] is a 88 yo M with a PMH of HTN, HLD, chronic atrial fibrillation, thoracic/abdominal aortic aneurysm (ascending aorta 5.9cm) c/b moderate aortic valve insufficiency, tobacco use disorder, COPD, h/o L femur fracture 2021, and T2DM.   Overnight Events: NAEO.  Patient states he continues to have some pain in his RLE but otherwise is doing well.   Objective:  Vital signs in last 24 hours: Vitals:   02/18/22 0639 02/18/22 0755 02/18/22 0831 02/18/22 1547  BP: (!) 128/57 126/66  114/72  Pulse: 100 (!) 106 99 (!) 105  Resp:  16  16  Temp:  98.1 F (36.7 C)  97.9 F (36.6 C)  TempSrc:  Oral  Oral  SpO2: 97% 98%  97%  Weight:      Height:       Supplemental O2: Nasal Cannula SpO2: 97 % O2 Flow Rate (L/min): 1 L/min   Physical Exam:  Constitutional: Well-developed, well-nourished, and in no distress.  HENT:  Head: Normocephalic and atraumatic.  Cardiovascular: tachycardic rate, irregular rhythm, intact distal pulses. No gallop and no friction rub.  No murmur heard. No lower extremity edema  Pulmonary: Non labored breathing on room air, no wheezing or rales  Abdominal: Soft. Normal bowel sounds. Non distended and non tender Musculoskeletal: Normal range of motion.        General: No tenderness or edema.  Neurological: Alert and oriented to person, place, and time. Non focal  Skin: Skin is warm and dry.    Filed Weights   02/16/22 1059  Weight: 68 kg     Intake/Output Summary (Last 24 hours) at 02/18/2022 1640 Last data filed at 02/17/2022 2045 Gross per 24 hour  Intake --  Output 200 ml  Net -200 ml    Net IO Since Admission: -50 mL [02/18/22 1640]  Pertinent Labs:    Latest Ref Rng & Units 02/18/2022    2:20 AM 02/17/2022    2:41 AM 02/16/2022   12:15 PM  CBC  WBC 4.0 - 10.5 K/uL 9.7  9.4  8.5   Hemoglobin 13.0 - 17.0 g/dL 10.3  10.6  12.9   Hematocrit 39.0 - 52.0 % 29.5  31.6  39.7   Platelets 150 - 400 K/uL  107  115  146        Latest Ref Rng & Units 02/18/2022    2:20 AM 02/17/2022    2:41 AM 02/16/2022   12:15 PM  CMP  Glucose 70 - 99 mg/dL 117  139  117   BUN 8 - 23 mg/dL '12  14  9   '$ Creatinine 0.61 - 1.24 mg/dL 0.73  0.96  0.89   Sodium 135 - 145 mmol/L 136  138  139   Potassium 3.5 - 5.1 mmol/L 3.4  3.7  4.5   Chloride 98 - 111 mmol/L 106  104  106   CO2 22 - 32 mmol/L '21  22  26   '$ Calcium 8.9 - 10.3 mg/dL 9.1  9.0  9.4   Total Protein 6.5 - 8.1 g/dL 5.7  5.9  6.2   Total Bilirubin 0.3 - 1.2 mg/dL 1.2  1.6  1.0   Alkaline Phos 38 - 126 U/L 38  41  52   AST 15 - 41 U/L '28  25  19   '$ ALT 0 - 44 U/L '12  13  14     '$ Imaging: No results found.  Assessment/Plan:   Principal Problem:  Hip fracture (HCC) Active Problems:   Atrial fibrillation, chronic (HCC)   Orthostatic hypotension   Dizziness   Recurrent falls   Patient Summary: [name] is a 88 yo M with a PMH of HTN, HLD, chronic atrial fibrillation, thoracic/abdominal aortic aneurysm (ascending aorta 5.9cm) c/b moderate aortic valve insufficiency, tobacco use disorder, COPD, h/o L femur fracture 2021, and T2DM who presented after a fall in the setting of vertiginous symptoms found to have R comminuted intertrochanteric proximal femur fracture.   #R comminuted intertrochanteric proximal femur fracture  Patient had a fall after becoming dizzy.  He sustained a right femur fracture.  Now s/p open treatment right intertrochanteric fracture with cephalomedullary nail. 10 -Vitamin D levels normal, calcium within normal levels -May need outpatient DEXA scan  -Pain control -PT/OT  #Fall #Vertiginous symptoms  Orthostatic vital signs were negative though patient was unable to stand due to pain. Most likely related to his permanent A-fib as well as multiple medications that can can lower his blood pressure.   -Will hold on MRI for now -Vestibular PT  -PT/OT   #Permanent atrial fibrillation  Patient in atrial  fibrillation on admission.  Normotensive. He is currently on digoxin as well as metoprolol tartrate for this.  Anticoagulation is precluded by his falls. CHADSvasc score 3. HAS Bled score 2.  -Restarted home digoxin  -Holding metoprolol due to soft blood pressures    #Hypertension Normotensive while in the emergency department.  Patient actually has hypotension currently requiring midodrine in the outpatient setting. -Midodrine 5 mg 3 times daily   #COPD Continue home inhalers and PRN nebs    #T/AAA Evaluated by thoracic surgery recently no plans for intervention currently.  Diet: Normal IVF: None,None VTE: Enoxaparin Code: Full PT/OT recs: Pending. TOC recs: Resources not needed at this time.  Dispo: Anticipated discharge to [place], MD PGY-3 Internal Medicine  Pager [phone]

## 2022-02-18 NOTE — Evaluation (Signed)
Occupational Therapy Evaluation Patient Details Name: Phillip West MRN: [id number] DOB: 08-May-1930 Today's Date: 02/18/2022   History of Present Illness 88 y/o male presented to ED on 02/16/22 after fall at mailbox. Sustained R peritrochanteric fx. S/p R femur IM nail on 10/3. PMH includes abdominal aortic aneurysms, COPD, chronic Afib, T2DM, h/o L femur fx 2021   Clinical Impression   Pt at this time completed LB dressing and hygiene at bed level with max assist. Pt with increase in time was able to roll side to side with mod to max assist. Pt attempted to go from supine to siting but due to pain reported they can not complete at this time and reported " I need a good week or so and then I will be able to move more."  Pt currently with functional limitations due to the deficits listed below (see OT Problem List).  Pt will benefit from skilled OT to increase their safety and independence with ADL and functional mobility for ADL to facilitate discharge to venue listed below.        Recommendations for follow up therapy are one component of a multi-disciplinary discharge planning process, led by the attending physician.  Recommendations may be updated based on patient status, additional functional criteria and insurance authorization.   Follow Up Recommendations  Skilled nursing-short term rehab (<3 hours/day)    Assistance Recommended at Discharge Frequent or constant Supervision/Assistance  Patient can return home with the following A lot of help with walking and/or transfers;A lot of help with bathing/dressing/bathroom;Assistance with cooking/housework;Assistance with feeding;Assist for transportation    Functional Status Assessment  Patient has had a recent decline in their functional status and demonstrates the ability to make significant improvements in function in a reasonable and predictable amount of time.  Equipment Recommendations  None recommended by OT (TBD)    Recommendations  for Other Services       Precautions / Restrictions Precautions Precautions: Fall Precaution Comments: dizziness, R wrist fx (12/2021) - patient states able to bear weight through wrist Restrictions Weight Bearing Restrictions: No      Mobility Bed Mobility Overal bed mobility: Needs Assistance Bed Mobility: Rolling Rolling: Mod assist         General bed mobility comments: Pt refused any further mobility. Pt did attempt but decline with multiple attempts and reported"we are going to go at my pace for things"    Transfers                   General transfer comment: unable      Balance                                           ADL either performed or assessed with clinical judgement   ADL Overall ADL's : Needs assistance/impaired Eating/Feeding: Independent;Sitting   Grooming: Wash/dry hands;Wash/dry face;Set up;Bed level   Upper Body Bathing: Minimal assistance;Cueing for safety;Cueing for sequencing;Sitting;Bed level   Lower Body Bathing: Maximal assistance;Total assistance;Cueing for safety;Cueing for sequencing;Bed level   Upper Body Dressing : Minimal assistance;Cueing for safety;Cueing for sequencing;Sitting;Bed level   Lower Body Dressing: Maximal assistance;Total assistance;Cueing for safety;Cueing for sequencing;Bed level       Toileting- Clothing Manipulation and Hygiene: Maximal assistance;Cueing for safety;Cueing for sequencing;Bed level         General ADL Comments: Deffer OOB mobility as pt refusing at this time  Vision Baseline Vision/History: 1 Wears glasses       Perception     Praxis      Pertinent Vitals/Pain Pain Assessment Pain Assessment: Faces Faces Pain Scale: Hurts whole lot Pain Location: R hip Pain Descriptors / Indicators: Grimacing, Discomfort, Guarding Pain Intervention(s): Limited activity within patient's tolerance, Monitored during session, Repositioned     Hand Dominance      Extremity/Trunk Assessment Upper Extremity Assessment Upper Extremity Assessment: LUE deficits/detail LUE Deficits / Details: Pt has brace for LUE but WBAT LUE Sensation: WNL LUE Coordination: WNL   Lower Extremity Assessment Lower Extremity Assessment: Defer to PT evaluation       Communication Communication Communication: No difficulties   Cognition Arousal/Alertness: Awake/alert Behavior During Therapy: WFL for tasks assessed/performed Overall Cognitive Status: Within Functional Limits for tasks assessed                                       General Comments       Exercises     Shoulder Instructions      Home Living Family/patient expects to be discharged to:: Private residence Living Arrangements: Children Available Help at Discharge: Family Type of Home: House Home Access: Stairs to enter Technical brewer of Steps: 4 Entrance Stairs-Rails: Right Home Layout: One level     Bathroom Shower/Tub: Occupational psychologist: Handicapped height     Home Equipment: Shower seat;Grab bars - tub/shower          Prior Functioning/Environment Prior Level of Function : Independent/Modified Independent;History of Falls (last six months)             Mobility Comments: ambulates with no AD, driving. Recent fall 12/2021 resulting in L wrist fx. Patient states he is able to bear weight through wrist          OT Problem List: Decreased strength;Decreased range of motion;Decreased activity tolerance;Impaired balance (sitting and/or standing);Decreased safety awareness;Decreased knowledge of use of DME or AE;Pain      OT Treatment/Interventions: Self-care/ADL training;DME and/or AE instruction;Therapeutic activities;Balance training;Patient/family education    OT Goals(Current goals can be found in the care plan section) Acute Rehab OT Goals Patient Stated Goal: to do what I can OT Goal Formulation: With patient Time For Goal  Achievement: 03/04/22 Potential to Achieve Goals: Good  OT Frequency: Min 2X/week    Co-evaluation              AM-PAC OT "6 Clicks" Daily Activity     Outcome Measure Help from another person eating meals?: None Help from another person taking care of personal grooming?: A Little Help from another person toileting, which includes using toliet, bedpan, or urinal?: A Lot Help from another person bathing (including washing, rinsing, drying)?: A Lot Help from another person to put on and taking off regular upper body clothing?: A Little Help from another person to put on and taking off regular lower body clothing?: A Lot 6 Click Score: 16   End of Session Nurse Communication: Mobility status  Activity Tolerance: Patient limited by pain Patient left: in bed;with call bell/phone within reach;with bed alarm set  OT Visit Diagnosis: Unsteadiness on feet (R26.81);Other abnormalities of gait and mobility (R26.89);Repeated falls (R29.6);Muscle weakness (generalized) (M62.81);Pain Pain - Right/Left: Right Pain - part of body: Leg                Time: 6834-1962 OT Time Calculation (  min): 43 min Charges:  OT General Charges $OT Visit: 1 Visit OT Evaluation $OT Eval Low Complexity: 1 Low OT Treatments $Self Care/Home Management : 23-37 mins  Joeseph Amor OTR/L  Acute Rehab Services  [phone] office number [phone] pager number   Joeseph Amor 02/18/2022, 12:29 PM

## 2022-02-19 ENCOUNTER — Other Ambulatory Visit (HOSPITAL_COMMUNITY): Payer: Self-pay

## 2022-02-19 ENCOUNTER — Inpatient Hospital Stay (HOSPITAL_COMMUNITY): Payer: No Typology Code available for payment source

## 2022-02-19 ENCOUNTER — Encounter (HOSPITAL_COMMUNITY): Payer: Self-pay | Admitting: Orthopedic Surgery

## 2022-02-19 ENCOUNTER — Telehealth (HOSPITAL_COMMUNITY): Payer: Self-pay

## 2022-02-19 DIAGNOSIS — S72001A Fracture of unspecified part of neck of right femur, initial encounter for closed fracture: Secondary | ICD-10-CM | POA: Diagnosis not present

## 2022-02-19 DIAGNOSIS — R42 Dizziness and giddiness: Secondary | ICD-10-CM | POA: Diagnosis not present

## 2022-02-19 DIAGNOSIS — I482 Chronic atrial fibrillation, unspecified: Secondary | ICD-10-CM | POA: Diagnosis not present

## 2022-02-19 LAB — BASIC METABOLIC PANEL
Anion gap: 9 (ref 5–15)
BUN: 13 mg/dL (ref 8–23)
CO2: 21 mmol/L — ABNORMAL LOW (ref 22–32)
Calcium: 9 mg/dL (ref 8.9–10.3)
Chloride: 105 mmol/L (ref 98–111)
Creatinine, Ser: 0.73 mg/dL (ref 0.61–1.24)
GFR, Estimated: >60 mL/min (ref >60)
Glucose, Bld: 112 mg/dL — ABNORMAL HIGH (ref 70–99)
Potassium: 3.5 mmol/L (ref 3.5–5.1)
Sodium: 135 mmol/L (ref 135–145)

## 2022-02-19 LAB — CBC
HCT: 30.8 % — ABNORMAL LOW (ref 39.0–52.0)
Hemoglobin: 10.4 g/dL — ABNORMAL LOW (ref 13.0–17.0)
MCH: 32.9 pg (ref 26.0–34.0)
MCHC: 33.8 g/dL (ref 30.0–36.0)
MCV: 97.5 fL (ref 80.0–100.0)
Platelets: 115 10*3/uL — ABNORMAL LOW (ref 150–400)
RBC: 3.16 MIL/uL — ABNORMAL LOW (ref 4.22–5.81)
RDW: 13.7 % (ref 11.5–15.5)
WBC: 10 10*3/uL (ref 4.0–10.5)
nRBC: 0 % (ref 0.0–0.2)

## 2022-02-19 LAB — TSH: TSH: 1.715 u[IU]/mL (ref 0.350–4.500)

## 2022-02-19 MED ORDER — AMIODARONE HCL IN DEXTROSE 360-4.14 MG/200ML-% IV SOLN
60.0000 mg/h | INTRAVENOUS | Status: AC
  Administered 2022-02-19 (×2): 60 mg/h via INTRAVENOUS
  Filled 2022-02-19 (×2): qty 200

## 2022-02-19 MED ORDER — AMIODARONE LOAD VIA INFUSION
150.0000 mg | Freq: Once | INTRAVENOUS | Status: AC
  Administered 2022-02-19: 150 mg via INTRAVENOUS
  Filled 2022-02-19: qty 83.34

## 2022-02-19 MED ORDER — MORPHINE SULFATE (PF) 2 MG/ML IV SOLN: 0.5000 mg | INTRAVENOUS | Status: DC | PRN

## 2022-02-19 MED ORDER — AMIODARONE HCL IN DEXTROSE 360-4.14 MG/200ML-% IV SOLN
30.0000 mg/h | INTRAVENOUS | Status: DC
  Administered 2022-02-20 – 2022-02-21 (×2): 30 mg/h via INTRAVENOUS
  Filled 2022-02-19 (×3): qty 200

## 2022-02-19 NOTE — Consult Note (Signed)
Referring Physician: Velna Ochs MD/Prinston Eulas Post, MD  Phillip West is an 88 y.o. male.                       Chief Complaint: Atrial fibrillation with RVR  HPI: 88 years old white male with PMH of Abdominal aortic aneurysm, (not a surgical candidate), atrial fibrillation, COPD with tobacco use disorder, Type 2 DM, HTN and moderate aortic valve insufficiency has recent right hip fracture with surgery. He has hypotension episodes with metoprolol use and is being considered for amiodarone use instead. His LFTs are near normal. CXR shows bibasilar atelactasis and TSH is pending. Renal function is normal.  Past Medical History:  Diagnosis Date   Abdominal aortic aneurysm (AAA) (HCC)    Atrial fibrillation ([place])    Chalazion right lower eyelid    COPD (chronic obstructive pulmonary disease) ([place])    Diabetes mellitus ([place])    Gross hematuria    Hypertension    Syncope and collapse    Thoracic aortic aneurysm (TAA) ([place])       Past Surgical History:  Procedure Laterality Date   HERNIA REPAIR     INTRAMEDULLARY (IM) NAIL INTERTROCHANTERIC Left 04/26/2020   Procedure: INTRAMEDULLARY (IM) NAIL INTERTROCHANTRIC;  Surgeon: Erle Crocker, MD;  Location: [facility];  Service: Orthopedics;  Laterality: Left;   KNEE ARTHROSCOPY      Family History  Problem Relation Age of Onset   Heart disease Father    Stroke Father    Social History:  reports that he has been smoking cigarettes. He started smoking about 50 years ago. He has been smoking an average of 1 pack per day. He has never used smokeless tobacco. He reports that he does not currently use alcohol. He reports that he does not use drugs.  Allergies:  Allergies  Allergen Reactions   Quinolones Other (See Comments)    Patient was warned about not using Cipro and similar antibiotics. Recent studies have raised concern that fluoroquinolone antibiotics could be associated with an increased risk of aortic  aneurysm Fluoroquinolones have non-antimicrobial properties that might jeopardise the integrity of the extracellular matrix of the vascular wall In a  propensity score matched cohort study in [place], there was a 66% increased rate of aortic aneurysm or dissection associated with oral fluoroquinolone use, compared wit   Ciprofloxacin Other (See Comments)    IRREGULAR HEART RATE    Medications Prior to Admission  Medication Sig Dispense Refill   albuterol (VENTOLIN HFA) 108 (90 Base) MCG/ACT inhaler Inhale 1 puff into the lungs every 6 (six) hours as needed for wheezing or shortness of breath.     Cholecalciferol (VITAMIN D) 50 MCG (2000 UT) tablet Take 1 tablet by mouth daily.     digoxin (LANOXIN) 0.125 MG tablet Take 1 tablet (0.125 mg total) by mouth daily. 30 tablet 0   LUBRICATING PLUS EYE DROPS 0.5 % SOLN Place 1 drop into both eyes daily.     metoprolol tartrate (LOPRESSOR) 25 MG tablet Take 0.5 tablets (12.5 mg total) by mouth 2 (two) times daily. 30 tablet 0   midodrine (PROAMATINE) 5 MG tablet Take 1 tablet (5 mg total) by mouth 3 (three) times daily with meals. 90 tablet 0   pravastatin (PRAVACHOL) 40 MG tablet Take 1 tablet by mouth daily.     SYMBICORT 160-4.5 MCG/ACT inhaler Inhale 2 puffs into the lungs 2 (two) times daily.     tamsulosin (FLOMAX) 0.4 MG CAPS capsule Take  0.4 mg by mouth daily.      Results for orders placed or performed during the hospital encounter of 02/16/22 (from the past 48 hour(s))  CBC     Status: Abnormal   Collection Time: 02/18/22  2:20 AM  Result Value Ref Range   WBC 9.7 4.0 - 10.5 K/uL   RBC 3.07 (L) 4.22 - 5.81 MIL/uL   Hemoglobin 10.3 (L) 13.0 - 17.0 g/dL   HCT 29.5 (L) 39.0 - 52.0 %   MCV 96.1 80.0 - 100.0 fL   MCH 33.6 26.0 - 34.0 pg   MCHC 34.9 30.0 - 36.0 g/dL   RDW 13.8 11.5 - 15.5 %   Platelets 107 (L) 150 - 400 K/uL    Comment: Immature Platelet Fraction may be clinically indicated, consider ordering this additional  test [id number] REPEATED TO VERIFY    nRBC 0.0 0.0 - 0.2 %    Comment: Performed at [facility], [place] [address], [place], [place] [address]  Comprehensive metabolic panel     Status: Abnormal   Collection Time: 02/18/22  2:20 AM  Result Value Ref Range   Sodium 136 135 - 145 mmol/L   Potassium 3.4 (L) 3.5 - 5.1 mmol/L   Chloride 106 98 - 111 mmol/L   CO2 21 (L) 22 - 32 mmol/L   Glucose, Bld 117 (H) 70 - 99 mg/dL    Comment: Glucose reference range applies only to samples taken after fasting for at least 8 hours.   BUN 12 8 - 23 mg/dL   Creatinine, Ser 0.73 0.61 - 1.24 mg/dL   Calcium 9.1 8.9 - 10.3 mg/dL   Total Protein 5.7 (L) 6.5 - 8.1 g/dL   Albumin 3.3 (L) 3.5 - 5.0 g/dL   AST 28 15 - 41 U/L   ALT 12 0 - 44 U/L   Alkaline Phosphatase 38 38 - 126 U/L   Total Bilirubin 1.2 0.3 - 1.2 mg/dL   GFR, Estimated >60 >60 mL/min    Comment: (NOTE) Calculated using the CKD-EPI Creatinine Equation (2021)    Anion gap 9 5 - 15    Comment: Performed at [facility], [place] [address] [place], [place] [address]  Magnesium     Status: None   Collection Time: 02/18/22  2:20 AM  Result Value Ref Range   Magnesium 1.8 1.7 - 2.4 mg/dL    Comment: Performed at [facility], [place] [address] [place], [place] [address]  Basic metabolic panel     Status: Abnormal   Collection Time: 02/19/22  7:42 AM  Result Value Ref Range   Sodium 135 135 - 145 mmol/L   Potassium 3.5 3.5 - 5.1 mmol/L   Chloride 105 98 - 111 mmol/L   CO2 21 (L) 22 - 32 mmol/L   Glucose, Bld 112 (H) 70 - 99 mg/dL    Comment: Glucose reference range applies only to samples taken after fasting for at least 8 hours.   BUN 13 8 - 23 mg/dL   Creatinine, Ser 0.73 0.61 - 1.24 mg/dL   Calcium 9.0 8.9 - 10.3 mg/dL   GFR, Estimated >60 >60 mL/min    Comment: (NOTE) Calculated using the CKD-EPI Creatinine Equation (2021)    Anion gap 9 5 - 15    Comment: Performed at [facility], [place] [address] [place], [place] [address]  CBC     Status: Abnormal   Collection Time: 02/19/22  7:42 AM  Result  Value Ref Range   WBC 10.0 4.0 - 10.5 K/uL   RBC 3.16 (L) 4.22 - 5.81 MIL/uL   Hemoglobin 10.4 (L) 13.0 - 17.0 g/dL   HCT 30.8 (L) 39.0 - 52.0 %   MCV 97.5 80.0 - 100.0 fL   MCH 32.9 26.0 - 34.0 pg   MCHC 33.8 30.0 - 36.0 g/dL   RDW 13.7 11.5 - 15.5 %   Platelets 115 (L) 150 - 400 K/uL   nRBC 0.0 0.0 - 0.2 %    Comment: Performed at [facility], [place] [address] [place], [place] [address]   DG CHEST PORT 1 VIEW  Result Date: 02/19/2022 CLINICAL DATA:  Atrial fibrillation. EXAM: PORTABLE CHEST 1 VIEW COMPARISON:  Chest radiograph 04/25/2020 and chest CT 08/24/2021 FINDINGS: The cardiac silhouette remains enlarged. Prominence of the thoracic aorta is noted in the setting of a known aortic aneurysm. There is aortic atherosclerosis. Lung volumes are low with mild streaky opacities in the lung bases. No sizable pleural effusion, overt pulmonary edema, or pneumothorax is identified. No acute osseous abnormality is seen. IMPRESSION: Low lung volumes with bibasilar atelectasis. Electronically Signed   By: Logan Bores M.D.   On: 02/19/2022 12:43    Review Of Systems Constitutional: No fever, chills, weight loss or gain. Eyes: No vision change, wears glasses. No discharge or pain. Ears: No hearing loss, No tinnitus. Respiratory: No asthma, positive COPD, no pneumonias. Positive shortness of breath. No hemoptysis. Cardiovascular: Positive chest pain, palpitation, no leg edema. Gastrointestinal: No nausea, vomiting, diarrhea, constipation. No GI bleed. No hepatitis. Genitourinary: No dysuria, hematuria, kidney stone. No incontinance. Neurological: No headache, stroke, seizures.  Psychiatry: No psych facility admission for anxiety, depression, suicide. No detox. Skin: No rash. Musculoskeletal: Positive joint pain, no fibromyalgia. No neck pain, back pain. Lymphadenopathy: No  lymphadenopathy. Hematology: Positive anemia or easy bruising.   Blood pressure 113/60, pulse (!) 107, temperature 98.4 F (36.9 C), temperature source Oral, resp. rate 18, height '5\' 8"'$  (1.727 m), weight 68 kg, SpO2 96 %. Body mass index is 22.79 kg/m. General appearance: alert, cooperative, appears stated age and mild respiratory distress Head: Normocephalic, atraumatic. Eyes: Blue eyes, pink conjunctiva, corneas clear.  Neck: No adenopathy, no carotid bruit, no JVD, supple, symmetrical, trachea midline and thyroid not enlarged. Resp: Clear to auscultation bilaterally. Cardio: Irregular rate and rhythm, S1, S2 normal, II/VI systolic murmur, no click, rub or gallop GI: Soft, non-tender; bowel sounds normal; no organomegaly. Extremities: No edema, cyanosis or clubbing. Skin: Warm and dry.  Neurologic: Alert and oriented X 3, normal strength.   Assessment/Plan Atrial fibrillation with RVR COPD HTN AA Aneurysm Type 2 DM  Plan: Agree with amiodarone use for heart rate control. Monitor dig level with amiodarone.  Time spent: Review of old records, Lab, x-rays, EKG, other cardiac tests, examination, discussion with patient/Family/Nurse/Doctor over 70 minutes.  Birdie Riddle, MD  02/19/2022, 12:57 PM

## 2022-02-19 NOTE — Progress Notes (Signed)
Report given to [facility]

## 2022-02-19 NOTE — Progress Notes (Signed)
PT Cancellation Note  Patient Details Name: [name] MRN: [id number] DOB: [birth year]   Cancelled Treatment:    Reason Eval/Treat Not Completed: Medical issues which prohibited therapy Patient transferred to [facility] with elevated HR despite medications. Will hold PT this date and follow up later date.   Ashlie Mcmenamy A. Gilford Rile PT, DPT Acute Rehabilitation Services Office [phone]    Phillip West 02/19/2022, 3:08 PM

## 2022-02-19 NOTE — TOC Benefit Eligibility Note (Signed)
Patient Teacher, English as a foreign language completed.    The current 30 day co-pay is, $0 for Xarelto '10mg'$ .   The patient is insured through [employer] ([employer] and [employer].   Otis Brace, [place] Patient Advocate Specialist [facility] Patient Advocate Team Direct Number: [phone]  Fax: [phone]

## 2022-02-19 NOTE — Progress Notes (Signed)
   02/19/22 1151  Assess: MEWS Score  Temp 98.4 F (36.9 C)  BP 113/60 (correction)  MAP (mmHg) 76  Pulse Rate (!) 140  Resp 18  Level of Consciousness Alert  SpO2 96 %  O2 Device Nasal Cannula  O2 Flow Rate (L/min) 2 L/min  Assess: MEWS Score  MEWS Temp 0  MEWS Systolic 0  MEWS Pulse 3  MEWS RR 0  MEWS LOC 0  MEWS Score 3  MEWS Score Color Yellow  Assess: if the MEWS score is Yellow or Red  Were vital signs taken at a resting state? Yes  Focused Assessment No change from prior assessment  Does the patient meet 2 or more of the SIRS criteria? No  MEWS guidelines implemented *See Row Information* No, previously yellow, continue vital signs every 4 hours  Treat  Pain Scale 0-10  Pain Score 0  Notify: Charge Nurse/RN  Name of Charge Nurse/RN Notified Hulan Amato  Date Charge Nurse/RN Notified 02/19/22  Time Charge Nurse/RN Notified 25  Notify: Provider  Provider Name/Title Dr Philipp Ovens  Date Provider Notified 02/19/22  Time Provider Notified 1151  Method of Notification Page  Notification Reason Other (Comment) (HR still elevated)  Provider response Other (Comment) (waiting for orders)  Date of Provider Response 02/19/22  Document  Patient Outcome Transferred/level of care increased  Progress note created (see row info) Yes  Assess: SIRS CRITERIA  SIRS Temperature  0  SIRS Pulse 1  SIRS Respirations  0  SIRS WBC 1  SIRS Score Sum  2

## 2022-02-19 NOTE — Plan of Care (Signed)

## 2022-02-19 NOTE — Progress Notes (Signed)
   02/19/22 0810  Assess: MEWS Score  Temp 97.6 F (36.4 C)  BP 111/81  MAP (mmHg) 91  Pulse Rate (!) 145  Resp 17  Level of Consciousness Alert  SpO2 96 %  O2 Device Nasal Cannula  O2 Flow Rate (L/min) 2 L/min  Assess: MEWS Score  MEWS Temp 0  MEWS Systolic 0  MEWS Pulse 3  MEWS RR 0  MEWS LOC 0  MEWS Score 3  MEWS Score Color Yellow  Assess: if the MEWS score is Yellow or Red  Were vital signs taken at a resting state? Yes  Focused Assessment No change from prior assessment  Does the patient meet 2 or more of the SIRS criteria? No  MEWS guidelines implemented *See Row Information* Yes  Treat  MEWS Interventions Administered scheduled meds/treatments  Pain Scale 0-10  Pain Score 0  Pain Type Surgical pain  Take Vital Signs  Increase Vital Sign Frequency  Yellow: Q 2hr X 2 then Q 4hr X 2, if remains yellow, continue Q 4hrs  Escalate  MEWS: Escalate Yellow: discuss with charge nurse/RN and consider discussing with provider and RRT  Notify: Charge Nurse/RN  Name of Charge Nurse/RN Notified Hulan Amato RN  Date Charge Nurse/RN Notified 02/19/22  Time Charge Nurse/RN Notified 0815  Notify: Provider  Provider Name/Title Dr Jodell Cipro  Date Provider Notified 02/19/22  Time Provider Notified 0830  Method of Notification  (chat)  Notification Reason Other (Comment) (HR elevated)  Provider response No new orders  Date of Provider Response 02/19/22  Time of Provider Response 0830  Document  Patient Outcome Other (Comment) (will continue to monitor)  Assess: SIRS CRITERIA  SIRS Temperature  0  SIRS Pulse 1  SIRS Respirations  0  SIRS WBC 1  SIRS Score Sum  2

## 2022-02-19 NOTE — Progress Notes (Signed)
Called 3E to give report, bed not ready yet.

## 2022-02-19 NOTE — Progress Notes (Addendum)
HD#3 Subjective:   Summary: Mr. Phillipe Clemon is a 88 yo M with a PMH of HTN, HLD, chronic atrial fibrillation, thoracic/abdominal aortic aneurysm (ascending aorta 5.9cm) c/b moderate aortic valve insufficiency, tobacco use disorder, COPD, h/o L femur fracture 2021, and T2DM p/w fall and R hip fx POD 2 s\p IM Intratrochanteric nail.   Overnight Events: NAEON.  Reports that his pain is well controlled and he has been able to move to chair from the bed, but does not feel comfortable walking around yet.  He is still endorsing dizziness, even at rest.  He additionally still feels like he is in A-fib with palpitations.  He denies any dyspnea, chest pain, subjective fevers.  Objective:  Vital signs in last 24 hours: Vitals:   02/19/22 0832 02/19/22 0928 02/19/22 1151 02/19/22 1331  BP:   113/60 124/73  Pulse:   (!) 140 (!) 141  Resp:   18 17  Temp:   98.4 F (36.9 C) 97.8 F (36.6 C)  TempSrc:   Oral Oral  SpO2: 97% 94% 96% 99%  Weight:      Height:       Supplemental O2: Nasal Cannula SpO2: 99 % O2 Flow Rate (L/min): 2 L/min   Physical Exam:  Constitutional: Well-developed, well-nourished, and in no distress.  HENT:  Head: Normocephalic and atraumatic.  Cardiovascular: tachycardic rate, irregular rhythm.  No M/R/G. Pulmonary: Non labored breathing on 2L [place], no wheezing or rales  Extremities: RLE dressing clean dry and intact   Filed Weights   02/16/22 1059  Weight: 68 kg     Intake/Output Summary (Last 24 hours) at 02/19/2022 1514 Last data filed at 02/19/2022 1248 Gross per 24 hour  Intake 300 ml  Output 200 ml  Net 100 ml   Net IO Since Admission: 50 mL [02/19/22 1514]  Pertinent Labs:    Latest Ref Rng & Units 02/19/2022    7:42 AM 02/18/2022    2:20 AM 02/17/2022    2:41 AM  CBC  WBC 4.0 - 10.5 K/uL 10.0  9.7  9.4   Hemoglobin 13.0 - 17.0 g/dL 10.4  10.3  10.6   Hematocrit 39.0 - 52.0 % 30.8  29.5  31.6   Platelets 150 - 400 K/uL 115  107  115         Latest Ref Rng & Units 02/19/2022    7:42 AM 02/18/2022    2:20 AM 02/17/2022    2:41 AM  CMP  Glucose 70 - 99 mg/dL 112  117  139   BUN 8 - 23 mg/dL '13  12  14   '$ Creatinine 0.61 - 1.24 mg/dL 0.73  0.73  0.96   Sodium 135 - 145 mmol/L 135  136  138   Potassium 3.5 - 5.1 mmol/L 3.5  3.4  3.7   Chloride 98 - 111 mmol/L 105  106  104   CO2 22 - 32 mmol/L '21  21  22   '$ Calcium 8.9 - 10.3 mg/dL 9.0  9.1  9.0   Total Protein 6.5 - 8.1 g/dL  5.7  5.9   Total Bilirubin 0.3 - 1.2 mg/dL  1.2  1.6   Alkaline Phos 38 - 126 U/L  38  41   AST 15 - 41 U/L  28  25   ALT 0 - 44 U/L  12  13   TSH 1.715  Imaging: DG CHEST PORT 1 VIEW  Result Date: 02/19/2022 CLINICAL DATA:  Atrial fibrillation. EXAM:  PORTABLE CHEST 1 VIEW COMPARISON:  Chest radiograph 04/25/2020 and chest CT 08/24/2021 FINDINGS: The cardiac silhouette remains enlarged. Prominence of the thoracic aorta is noted in the setting of a known aortic aneurysm. There is aortic atherosclerosis. Lung volumes are low with mild streaky opacities in the lung bases. No sizable pleural effusion, overt pulmonary edema, or pneumothorax is identified. No acute osseous abnormality is seen. IMPRESSION: Low lung volumes with bibasilar atelectasis. Electronically Signed   By: Logan Bores M.D.   On: 02/19/2022 12:43    Assessment/Plan:   Principal Problem:   Hip fracture ([place]) Active Problems:   Atrial fibrillation, chronic (HCC)   Orthostatic hypotension   Dizziness   Recurrent falls   Patient Summary: [name] is a 88 yo M with a PMH of HTN, HLD, chronic atrial fibrillation, thoracic/abdominal aortic aneurysm (ascending aorta 5.9cm) c/b moderate aortic valve insufficiency, tobacco use disorder, COPD, h/o L femur fracture 2021, and T2DM who presented after a fall in the setting of vertiginous symptoms found to have R comminuted intertrochanteric proximal femur fracture s/p operative fixation.   #R comminuted intertrochanteric proximal femur  fracture  Patient had a fall after becoming dizzy.  He sustained a right femur fracture.  Now POD 2 s/p open treatment right intertrochanteric fracture with cephalomedullary nail.  Recovering well with pain well managed. -Vitamin D levels normal, calcium within normal levels -Needs prior to Cheerios for her osteoporosis given multiple fractures from ground-level, but can confirm with outpatient DEXA scan  -Pain control with Tylenol, Norco, morphine as needed -PT/OT  #Fall #Vertiginous symptoms  Orthostatics incomplete as patient is not able to stand consistently due to pain.  Reports vertigo symptoms even at rest with previous hints exam equivocal.  Etiology possibly central vertigo, BPPV, cardiogenic.  Will further assess for central vertigo. -Follow-up brain MRI -Vestibular PT  -PT/OT   #Permanent atrial fibrillation, now with RVR Patient previously on digoxin as well as metoprolol tartrate for permanent A-fib.  Anticoagulation is precluded by his falls. CHADSvasc score 3. HAS Bled score 2.  This morning he is in A-fib with RVR rates 120s to 140s on exertion.  Dig was restarted previously.  He potentially becomes hypotensive on metoprolol which could be contributing to his falls.  We will will consult cardiology and consider amiodarone to control his A-fib. -Continue home digoxin and hold home metoprolol -Cardiology consulted.  Okay with starting amiodarone.  CXR, TSH, LFTs, Cr WNL.  Started amiodarone loading dose and fusion.  Consider transitioning to 200 mg daily once he converts to NSR.  #Chronic hypotension  Holding metoprolol. Patient hemodynamically stable while in A-fib with RVR.  -Midodrine 5 mg 3 times daily   #COPD Continue home inhalers and PRN nebs    #T/AAA Evaluated by thoracic surgery recently no plans for intervention currently.  Diet: Normal IVF: None,None VTE: Xarelto 10 mg daily Code: Full PT/OT recs: SNF TOC recs: Resources not needed at this time.  Dispo:  Anticipated discharge to SNF pending resolution of A-fib with RVR and further medical evaluation  Linus Galas, MD PGY1 Internal Medicine [phone]

## 2022-02-19 NOTE — Progress Notes (Signed)
Pt transferred to [place] [facility]. Family at bedside.

## 2022-02-19 NOTE — Progress Notes (Addendum)
Informed patient of q2 turn order; patient defers citing severe pain that "started this whole mess" (referring to RVR). Educated on risks of pressure injuries and increased danger of DVT in post-op patients; patient stated that he will continue to "do this" (demonstrating dorsiflexion and plantarflexion) and agrees to shift weight to tolerance of pain in lieu of q2 turns.  Assessment and documentation performed by Luetta Nutting, RN as orientee under this RN's supervision. Concurred with findings and documentation.

## 2022-02-19 NOTE — Telephone Encounter (Signed)
Pharmacy Patient Advocate Encounter  Insurance verification completed.    The patient is insured through [employer] ([employer]) and [employer].   The patient is currently admitted and ran test claims for the following: Xarelto '10mg'$ .  Copays and coinsurance results were relayed to Inpatient clinical team.

## 2022-02-19 NOTE — Plan of Care (Signed)
  Problem: Clinical Measurements: Goal: Respiratory complications will improve Outcome: Progressing Goal: Cardiovascular complication will be avoided Outcome: Progressing   

## 2022-02-20 ENCOUNTER — Inpatient Hospital Stay (HOSPITAL_COMMUNITY): Payer: No Typology Code available for payment source

## 2022-02-20 LAB — CBC
HCT: 30.1 % — ABNORMAL LOW (ref 39.0–52.0)
Hemoglobin: 9.8 g/dL — ABNORMAL LOW (ref 13.0–17.0)
MCH: 32.2 pg (ref 26.0–34.0)
MCHC: 32.6 g/dL (ref 30.0–36.0)
MCV: 99 fL (ref 80.0–100.0)
Platelets: 130 10*3/uL — ABNORMAL LOW (ref 150–400)
RBC: 3.04 MIL/uL — ABNORMAL LOW (ref 4.22–5.81)
RDW: 13.8 % (ref 11.5–15.5)
WBC: 8.6 10*3/uL (ref 4.0–10.5)
nRBC: 0 % (ref 0.0–0.2)

## 2022-02-20 LAB — FOLATE: Folate: 8.8 ng/mL (ref >5.9)

## 2022-02-20 LAB — COMPREHENSIVE METABOLIC PANEL
ALT: 13 U/L (ref 0–44)
AST: 26 U/L (ref 15–41)
Albumin: 2.9 g/dL — ABNORMAL LOW (ref 3.5–5.0)
Alkaline Phosphatase: 46 U/L (ref 38–126)
Anion gap: 10 (ref 5–15)
BUN: 12 mg/dL (ref 8–23)
CO2: 25 mmol/L (ref 22–32)
Calcium: 9 mg/dL (ref 8.9–10.3)
Chloride: 101 mmol/L (ref 98–111)
Creatinine, Ser: 0.75 mg/dL (ref 0.61–1.24)
GFR, Estimated: >60 mL/min (ref >60)
Glucose, Bld: 126 mg/dL — ABNORMAL HIGH (ref 70–99)
Potassium: 3.7 mmol/L (ref 3.5–5.1)
Sodium: 136 mmol/L (ref 135–145)
Total Bilirubin: 0.8 mg/dL (ref 0.3–1.2)
Total Protein: 5.5 g/dL — ABNORMAL LOW (ref 6.5–8.1)

## 2022-02-20 LAB — IRON AND TIBC
Iron: 45 ug/dL (ref 45–182)
Saturation Ratios: 18 % (ref 17.9–39.5)
TIBC: 249 ug/dL — ABNORMAL LOW (ref 250–450)
UIBC: 204 ug/dL

## 2022-02-20 LAB — MAGNESIUM: Magnesium: 1.9 mg/dL (ref 1.7–2.4)

## 2022-02-20 LAB — GLUCOSE, CAPILLARY: Glucose-Capillary: 117 mg/dL — ABNORMAL HIGH (ref 70–99)

## 2022-02-20 LAB — VITAMIN B12: Vitamin B-12: 389 pg/mL (ref 180–914)

## 2022-02-20 LAB — FERRITIN: Ferritin: 230 ng/mL (ref 24–336)

## 2022-02-20 LAB — DIGOXIN LEVEL: Digoxin Level: 0.3 ng/mL — ABNORMAL LOW (ref 0.8–2.0)

## 2022-02-20 MED ORDER — DIGOXIN 125 MCG PO TABS
0.0625 mg | ORAL_TABLET | Freq: Every day | ORAL | Status: DC
  Administered 2022-02-21 – 2022-02-24 (×4): 0.0625 mg via ORAL
  Filled 2022-02-20 (×4): qty 1

## 2022-02-20 MED ORDER — MAGNESIUM SULFATE 2 GM/50ML IV SOLN
2.0000 g | Freq: Once | INTRAVENOUS | Status: AC
  Administered 2022-02-20: 2 g via INTRAVENOUS
  Filled 2022-02-20: qty 50

## 2022-02-20 MED ORDER — POTASSIUM CHLORIDE 20 MEQ PO PACK
40.0000 meq | PACK | Freq: Once | ORAL | Status: AC
  Administered 2022-02-20: 40 meq via ORAL
  Filled 2022-02-20: qty 2

## 2022-02-20 MED ORDER — METOPROLOL TARTRATE 12.5 MG HALF TABLET
12.5000 mg | ORAL_TABLET | Freq: Two times a day (BID) | ORAL | Status: DC
  Administered 2022-02-20 – 2022-02-21 (×3): 12.5 mg via ORAL
  Filled 2022-02-20 (×3): qty 1

## 2022-02-20 NOTE — Progress Notes (Signed)
HD#4 Subjective:   Summary: [name] is a 88 yo M with a PMH of HTN, HLD, chronic atrial fibrillation, thoracic/abdominal aortic aneurysm (ascending aorta 5.9cm) c/b moderate aortic valve insufficiency, tobacco use disorder, COPD, h/o L femur fracture 2021, and T2DM p/w fall and R hip fx POD 2 s\p IM Intratrochanteric nail.   Overnight Events: NAEO.  Patient states that he is feels good today.  His hip does not hurt as long as she stays still.  He is amenable to physical therapy but states he wants to take it more slowly due to pain.  Denies chest pain.  Objective:  Vital signs in last 24 hours: Vitals:   02/19/22 2026 02/19/22 2028 02/20/22 0415 02/20/22 0816  BP: 116/61  (!) 124/58 113/67  Pulse: 82  82 80  Resp: (!) '26 16 18 17  '$ Temp: 97.8 F (36.6 C)  98.4 F (36.9 C)   TempSrc: Oral  Oral   SpO2: 96%  93% 95%  Weight:   62.8 kg   Height:       Supplemental O2: Nasal Cannula SpO2: 95 % O2 Flow Rate (L/min): 2 L/min   Physical Exam:  Constitutional: Well-developed, well-nourished, and in no distress.  HENT:  Head: Normocephalic and atraumatic.  Cardiovascular: Regular rate, irregular rhythm.  No M/R/G. Pulmonary: Non labored breathing on 2L [place], no wheezing or rales  Extremities: RLE dressing clean dry and intact. No ecchymosis. Rt DP pulse 2+.   Filed Weights   02/16/22 1059 02/20/22 0415  Weight: 68 kg 62.8 kg     Intake/Output Summary (Last 24 hours) at 02/20/2022 1049 Last data filed at 02/20/2022 0826 Gross per 24 hour  Intake 1337.77 ml  Output 1100 ml  Net 237.77 ml    Net IO Since Admission: 187.77 mL [02/20/22 1049]  Pertinent Labs:    Latest Ref Rng & Units 02/20/2022    6:06 AM 02/19/2022    7:42 AM 02/18/2022    2:20 AM  CBC  WBC 4.0 - 10.5 K/uL 8.6  10.0  9.7   Hemoglobin 13.0 - 17.0 g/dL 9.8  10.4  10.3   Hematocrit 39.0 - 52.0 % 30.1  30.8  29.5   Platelets 150 - 400 K/uL 130  115  107        Latest Ref Rng & Units  02/20/2022    6:06 AM 02/19/2022    7:42 AM 02/18/2022    2:20 AM  CMP  Glucose 70 - 99 mg/dL 126  112  117   BUN 8 - 23 mg/dL '12  13  12   '$ Creatinine 0.61 - 1.24 mg/dL 0.75  0.73  0.73   Sodium 135 - 145 mmol/L 136  135  136   Potassium 3.5 - 5.1 mmol/L 3.7  3.5  3.4   Chloride 98 - 111 mmol/L 101  105  106   CO2 22 - 32 mmol/L '25  21  21   '$ Calcium 8.9 - 10.3 mg/dL 9.0  9.0  9.1   Total Protein 6.5 - 8.1 g/dL 5.5   5.7   Total Bilirubin 0.3 - 1.2 mg/dL 0.8   1.2   Alkaline Phos 38 - 126 U/L 46   38   AST 15 - 41 U/L 26   28   ALT 0 - 44 U/L 13   12   TSH 1.715  Imaging: MR BRAIN WO CONTRAST  Result Date: 02/20/2022 CLINICAL DATA:  Initial evaluation for dizziness, vertigo. EXAM:  MRI HEAD WITHOUT CONTRAST TECHNIQUE: Multiplanar, multiecho pulse sequences of the brain and surrounding structures were obtained without intravenous contrast. COMPARISON:  Prior study from 05/14/2010. FINDINGS: Brain: Diffuse prominence of the CSF containing spaces compatible generalized cerebral atrophy. No significant cerebral Jocee Kissick matter disease for age. Area of encephalomalacia and gliosis involving the posterior right occipital lobe, likely a chronic ischemic infarct. Associated chronic hemosiderin staining at this location. Few small remote right cerebellar infarcts noted as well. No other evidence for acute or subacute ischemia. Gray-Daralyn Bert matter differentiation otherwise maintained. No other areas of chronic cortical infarction. No other acute or chronic intracranial blood products. No mass lesion, midline shift or mass effect. No hydrocephalus or extra-axial fluid collection. Pituitary gland is prominent with convex border superiorly (series 9, image 12), atypical for a male patient of this age. No visible discrete lesion on this non-contrast non pituitary protocol MRI. Vascular: Major intracranial vascular flow voids are maintained. Strongly dominant left vertebral artery, with a diffusely hypoplastic right  vertebral artery noted. Skull and upper cervical spine: Craniocervical junction within normal limits. Bone marrow signal intensity within normal limits. No scalp soft tissue abnormality. Sinuses/Orbits: Patient status post bilateral ocular lens replacement. Scattered mucosal thickening noted about the ethmoidal air cells and maxillary sinuses. Paranasal sinuses are otherwise clear. No significant mastoid effusion. Other: None. IMPRESSION: 1. No acute intracranial abnormality. 2. 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 3. Prominence of the pituitary gland with convex border superiorly, atypical for a male patient of this age. Correlation with laboratory values and pituitary function tests suggested. Further assessment with dedicated pituitary protocol MRI, with and without contrast, could be performed for further evaluation as clinically warranted. Electronically Signed   By: Jeannine Boga M.D.   On: 02/20/2022 01:47   DG CHEST PORT 1 VIEW  Result Date: 02/19/2022 CLINICAL DATA:  Atrial fibrillation. EXAM: PORTABLE CHEST 1 VIEW COMPARISON:  Chest radiograph 04/25/2020 and chest CT 08/24/2021 FINDINGS: The cardiac silhouette remains enlarged. Prominence of the thoracic aorta is noted in the setting of a known aortic aneurysm. There is aortic atherosclerosis. Lung volumes are low with mild streaky opacities in the lung bases. No sizable pleural effusion, overt pulmonary edema, or pneumothorax is identified. No acute osseous abnormality is seen. IMPRESSION: Low lung volumes with bibasilar atelectasis. Electronically Signed   By: Logan Bores M.D.   On: 02/19/2022 12:43    Assessment/Plan:   Principal Problem:   Hip fracture ([place]) Active Problems:   Atrial fibrillation, chronic (HCC)   Orthostatic hypotension   Dizziness   Recurrent falls   Patient Summary: [name] is a 88 yo M with a PMH of HTN, HLD, chronic atrial fibrillation, thoracic/abdominal aortic aneurysm  (ascending aorta 5.9cm) c/b moderate aortic valve insufficiency, tobacco use disorder, COPD, h/o L femur fracture 2021, and T2DM who presented after a fall in the setting of vertiginous symptoms found to have R comminuted intertrochanteric proximal femur fracture s/p operative fixation.   #R comminuted intertrochanteric proximal femur fracture  Patient had a fall after becoming dizzy.  He sustained a right femur fracture. Now POD 4 s/p open treatment right intertrochanteric fracture with cephalomedullary nail.  Pain well controlled.  Encouraged patient to work with PT today. -Vitamin D levels normal, calcium within normal limits -Consider DEXA scan in the outpatient setting -Pain control with Tylenol, Norco, morphine as needed -PT/OT  #Fall #Vertiginous symptoms  Orthostatics incomplete as patient is not able to stand consistently due to pain.  Reports vertigo symptoms even  at rest with previous hints exam equivocal.  Etiology possibly central vertigo, BPPV, cardiogenic.  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  This could contribute to imbalance but patient's history seems more consistent with cardiogenic etiology. -Vestibular PT  -Repeat orthostatics once able to stand   #Permanent atrial fibrillation with RVR Patient previously on digoxin as well as metoprolol tartrate for permanent A-fib.  Anticoagulation is precluded by his falls. CHADSvasc score 3. HAS Bled score 2.  Rate controlled today.  Hemodynamically stable. -Reduce digoxin to 0.125 mg half tablet daily -Resume metoprolol at 12.5 mg twice daily -Continue Amio load  #Chronic hypotension  Patient hemodynamically stable while in A-fib with RVR.  -Midodrine 5 mg 3 times daily   #COPD Continue home inhalers and PRN nebs    #T/AAA Evaluated by thoracic surgery recently no plans for intervention currently.  Diet: Normal IVF: None,None VTE: Xarelto 10 mg daily Code: Full PT/OT recs: SNF TOC recs: Resources not needed  at this time.  Dispo: Anticipated discharge to SNF pending resolution of A-fib with RVR and further medical evaluation  Linus Galas, MD PGY1 Internal Medicine [phone]

## 2022-02-20 NOTE — Progress Notes (Signed)
   02/20/22 1800  Assess: MEWS Score  Temp 97.6 F (36.4 C)  BP (!) 100/45  Resp 20  O2 Device Room Air  Assess: MEWS Score  MEWS Temp 0  MEWS Systolic 1  MEWS Pulse 0  MEWS RR 0  MEWS LOC 0  MEWS Score 1  MEWS Score Color Green  Assess: if the MEWS score is Yellow or Red  Were vital signs taken at a resting state? Yes  Focused Assessment No change from prior assessment  Does the patient meet 2 or more of the SIRS criteria? No  MEWS guidelines implemented *See Row Information* No, vital signs rechecked  Treat  Pain Scale 0-10  Pain Score 0  Assess: SIRS CRITERIA  SIRS Temperature  0  SIRS Pulse 0  SIRS Respirations  0  SIRS WBC 1  SIRS Score Sum  1

## 2022-02-20 NOTE — Progress Notes (Signed)
Subjective: Patient denies any chest pain or shortness of breath.  Remains in atrial fibrillation with moderate ventricular response.  Objective:  Vital Signs in the last 24 hours: Temp:  [97.8 F (36.6 C)-98.7 F (37.1 C)] 98.4 F (36.9 C) (10/07 0415) Pulse Rate:  [80-141] 80 (10/07 0816) Resp:  [11-27] 17 (10/07 0816) BP: (96-134)/(58-78) 113/67 (10/07 0816) SpO2:  [93 %-99 %] 95 % (10/07 0816) Weight:  [62.8 kg] 62.8 kg (10/07 0415)  Intake/Output from previous day: 10/06 0701 - 10/07 0700 In: 1167.8 [P.O.:780; I.V.:387.8] Out: 800 [Urine:800] Intake/Output from this shift: Total I/O In: 120 [P.O.:120] Out: 300 [Urine:300]  Physical Exam: Neck: no adenopathy, no carotid bruit, no JVD, and supple, symmetrical, trachea midline Lungs: clear to auscultation bilaterally Heart: irregularly irregular rhythm, S1, S2 normal, and soft systolic and diastolic murmur noted Abdomen: soft, non-tender; bowel sounds normal; no masses,  no organomegaly Extremities: extremities normal, atraumatic, no cyanosis or edema and right hip surgical site dry no hematoma noted  Lab Results: Recent Labs    02/19/22 0742 02/20/22 0606  WBC 10.0 8.6  HGB 10.4* 9.8*  PLT 115* 130*   Recent Labs    02/19/22 0742 02/20/22 0606  NA 135 136  K 3.5 3.7  CL 105 101  CO2 21* 25  GLUCOSE 112* 126*  BUN 13 12  CREATININE 0.73 0.75   No results for input(s): "TROPONINI" in the last 72 hours.  Invalid input(s): "CK", "MB" Hepatic Function Panel Recent Labs    02/20/22 0606  PROT 5.5*  ALBUMIN 2.9*  AST 26  ALT 13  ALKPHOS 46  BILITOT 0.8   No results for input(s): "CHOL" in the last 72 hours. No results for input(s): "PROTIME" in the last 72 hours.  Imaging: Imaging results have been reviewed and MR BRAIN WO CONTRAST  Result Date: 02/20/2022 CLINICAL DATA:  Initial evaluation for dizziness, vertigo. EXAM: MRI HEAD WITHOUT CONTRAST TECHNIQUE: Multiplanar, multiecho pulse sequences of  the brain and surrounding structures were obtained without intravenous contrast. COMPARISON:  Prior study from 05/14/2010. FINDINGS: Brain: Diffuse prominence of the CSF containing spaces compatible generalized cerebral atrophy. No significant cerebral white matter disease for age. Area of encephalomalacia and gliosis involving the posterior right occipital lobe, likely a chronic ischemic infarct. Associated chronic hemosiderin staining at this location. Few small remote right cerebellar infarcts noted as well. No other evidence for acute or subacute ischemia. Gray-white matter differentiation otherwise maintained. No other areas of chronic cortical infarction. No other acute or chronic intracranial blood products. No mass lesion, midline shift or mass effect. No hydrocephalus or extra-axial fluid collection. Pituitary gland is prominent with convex border superiorly (series 9, image 12), atypical for a male patient of this age. No visible discrete lesion on this non-contrast non pituitary protocol MRI. Vascular: Major intracranial vascular flow voids are maintained. Strongly dominant left vertebral artery, with a diffusely hypoplastic right vertebral artery noted. Skull and upper cervical spine: Craniocervical junction within normal limits. Bone marrow signal intensity within normal limits. No scalp soft tissue abnormality. Sinuses/Orbits: Patient status post bilateral ocular lens replacement. Scattered mucosal thickening noted about the ethmoidal air cells and maxillary sinuses. Paranasal sinuses are otherwise clear. No significant mastoid effusion. Other: None. IMPRESSION: 1. No acute intracranial abnormality. 2. 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 3. Prominence of the pituitary gland with convex border superiorly, atypical for a male patient of this age. Correlation with laboratory values and pituitary function tests suggested. Further assessment with dedicated pituitary protocol  MRI, with and  without contrast, could be performed for further evaluation as clinically warranted. Electronically Signed   By: Jeannine Boga M.D.   On: 02/20/2022 01:47   DG CHEST PORT 1 VIEW  Result Date: 02/19/2022 CLINICAL DATA:  Atrial fibrillation. EXAM: PORTABLE CHEST 1 VIEW COMPARISON:  Chest radiograph 04/25/2020 and chest CT 08/24/2021 FINDINGS: The cardiac silhouette remains enlarged. Prominence of the thoracic aorta is noted in the setting of a known aortic aneurysm. There is aortic atherosclerosis. Lung volumes are low with mild streaky opacities in the lung bases. No sizable pleural effusion, overt pulmonary edema, or pneumothorax is identified. No acute osseous abnormality is seen. IMPRESSION: Low lung volumes with bibasilar atelectasis. Electronically Signed   By: Logan Bores M.D.   On: 02/19/2022 12:43    Cardiac Studies:  Assessment/Plan:  Atrial fibrillation with moderate ventricular response COPD HTN AA Aneurysm Type 2 DM Plan Reduce digoxin to 0.125 mg half tablet daily Add low-dose beta-blockers as per orders  LOS: 4 days    Charolette Forward 02/20/2022, 8:52 AM

## 2022-02-21 DIAGNOSIS — R42 Dizziness and giddiness: Secondary | ICD-10-CM | POA: Diagnosis not present

## 2022-02-21 DIAGNOSIS — I482 Chronic atrial fibrillation, unspecified: Secondary | ICD-10-CM | POA: Diagnosis not present

## 2022-02-21 DIAGNOSIS — S72001A Fracture of unspecified part of neck of right femur, initial encounter for closed fracture: Secondary | ICD-10-CM | POA: Diagnosis not present

## 2022-02-21 LAB — COMPREHENSIVE METABOLIC PANEL
ALT: 26 U/L (ref 0–44)
AST: 45 U/L — ABNORMAL HIGH (ref 15–41)
Albumin: 2.8 g/dL — ABNORMAL LOW (ref 3.5–5.0)
Alkaline Phosphatase: 47 U/L (ref 38–126)
Anion gap: 7 (ref 5–15)
BUN: 12 mg/dL (ref 8–23)
CO2: 24 mmol/L (ref 22–32)
Calcium: 8.6 mg/dL — ABNORMAL LOW (ref 8.9–10.3)
Chloride: 101 mmol/L (ref 98–111)
Creatinine, Ser: 0.81 mg/dL (ref 0.61–1.24)
GFR, Estimated: >60 mL/min (ref >60)
Glucose, Bld: 126 mg/dL — ABNORMAL HIGH (ref 70–99)
Potassium: 3.7 mmol/L (ref 3.5–5.1)
Sodium: 132 mmol/L — ABNORMAL LOW (ref 135–145)
Total Bilirubin: 1.2 mg/dL (ref 0.3–1.2)
Total Protein: 5.8 g/dL — ABNORMAL LOW (ref 6.5–8.1)

## 2022-02-21 LAB — CBC
HCT: 28.3 % — ABNORMAL LOW (ref 39.0–52.0)
Hemoglobin: 9.7 g/dL — ABNORMAL LOW (ref 13.0–17.0)
MCH: 33 pg (ref 26.0–34.0)
MCHC: 34.3 g/dL (ref 30.0–36.0)
MCV: 96.3 fL (ref 80.0–100.0)
Platelets: 148 10*3/uL — ABNORMAL LOW (ref 150–400)
RBC: 2.94 MIL/uL — ABNORMAL LOW (ref 4.22–5.81)
RDW: 13.8 % (ref 11.5–15.5)
WBC: 9.8 10*3/uL (ref 4.0–10.5)
nRBC: 0.2 % (ref 0.0–0.2)

## 2022-02-21 MED ORDER — AMIODARONE HCL 200 MG PO TABS: 200.0000 mg | ORAL_TABLET | Freq: Every day | ORAL | Status: DC

## 2022-02-21 NOTE — Progress Notes (Signed)
PT found to have infiltrated IV during morning med pass. Amiodarone infusion changed to alternate IV site. Called pharmacy to confirm no required medications at infiltration site. Per pharmacy elevate, no pressure.  Infiltration area has been marked to monitor.  PT started to have episodes of bradycardia, rate drops noted at 12:44 (HR 39); 12:43 (HR 45); 12:29 (HR 48); 11:49 (HR 44).  Drip placed on pause, provider Dr. Carin Primrose paged at 1303.  Reviewed concern that Amiodarone with morning medications are causing bradycardia, provider at bedside. Amiodarone discontinued.   PT continued to have more frequent episodes of bradycardia into the 30's. Provider paged at 1550, updated regarding frequency and ongoing issues with heart rate 30s-50', minimally in 60's. Provider stated he may hold Metoprolol this evening. Will come assess patient.   Patient voiding in urinal, during activity heart rate increased to 80s and 90s.    Dr. Carin Primrose at bedside, PT reported did have an episode of dizziness and "spacing out" while his son was here earlier. Education provided to call for assistance if it happens again. Order for EKG to be placed by provider.

## 2022-02-21 NOTE — Progress Notes (Signed)
Subjective:  Doing well denies any chest pain or shortness of breath.  Denies palpitation lightheadedness remains in atrial fibrillation heart rate better controlled after restarting low-dose metoprolol.  Objective:  Vital Signs in the last 24 hours: Temp:  [97.6 F (36.4 C)-98.8 F (37.1 C)] 98.7 F (37.1 C) (10/08 0342) Pulse Rate:  [70-80] 77 (10/08 0342) Resp:  [17-25] 18 (10/08 0342) BP: (97-113)/(45-68) 107/66 (10/08 0342) SpO2:  [91 %-95 %] 95 % (10/08 0342) Weight:  [67.2 kg] 67.2 kg (10/08 0008)  Intake/Output from previous day: 10/07 0701 - 10/08 0700 In: 1245.9 [P.O.:920; I.V.:275.9; IV Piggyback:50] Out: 1625 [Urine:1625] Intake/Output from this shift: No intake/output data recorded.  Physical Exam: Exam unchanged  Lab Results: Recent Labs    02/20/22 0606 02/21/22 0044  WBC 8.6 9.8  HGB 9.8* 9.7*  PLT 130* 148*   Recent Labs    02/20/22 0606 02/21/22 0044  NA 136 132*  K 3.7 3.7  CL 101 101  CO2 25 24  GLUCOSE 126* 126*  BUN 12 12  CREATININE 0.75 0.81   No results for input(s): "TROPONINI" in the last 72 hours.  Invalid input(s): "CK", "MB" Hepatic Function Panel Recent Labs    02/21/22 0044  PROT 5.8*  ALBUMIN 2.8*  AST 45*  ALT 26  ALKPHOS 47  BILITOT 1.2   No results for input(s): "CHOL" in the last 72 hours. No results for input(s): "PROTIME" in the last 72 hours.  Imaging: Imaging results have been reviewed and MR BRAIN WO CONTRAST  Result Date: 02/20/2022 CLINICAL DATA:  Initial evaluation for dizziness, vertigo. EXAM: MRI HEAD WITHOUT CONTRAST TECHNIQUE: Multiplanar, multiecho pulse sequences of the brain and surrounding structures were obtained without intravenous contrast. COMPARISON:  Prior study from 05/14/2010. FINDINGS: Brain: Diffuse prominence of the CSF containing spaces compatible generalized cerebral atrophy. No significant cerebral white matter disease for age. Area of encephalomalacia and gliosis involving the  posterior right occipital lobe, likely a chronic ischemic infarct. Associated chronic hemosiderin staining at this location. Few small remote right cerebellar infarcts noted as well. No other evidence for acute or subacute ischemia. Gray-white matter differentiation otherwise maintained. No other areas of chronic cortical infarction. No other acute or chronic intracranial blood products. No mass lesion, midline shift or mass effect. No hydrocephalus or extra-axial fluid collection. Pituitary gland is prominent with convex border superiorly (series 9, image 12), atypical for a male patient of this age. No visible discrete lesion on this non-contrast non pituitary protocol MRI. Vascular: Major intracranial vascular flow voids are maintained. Strongly dominant left vertebral artery, with a diffusely hypoplastic right vertebral artery noted. Skull and upper cervical spine: Craniocervical junction within normal limits. Bone marrow signal intensity within normal limits. No scalp soft tissue abnormality. Sinuses/Orbits: Patient status post bilateral ocular lens replacement. Scattered mucosal thickening noted about the ethmoidal air cells and maxillary sinuses. Paranasal sinuses are otherwise clear. No significant mastoid effusion. Other: None. IMPRESSION: 1. No acute intracranial abnormality. 2. 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 3. Prominence of the pituitary gland with convex border superiorly, atypical for a male patient of this age. Correlation with laboratory values and pituitary function tests suggested. Further assessment with dedicated pituitary protocol MRI, with and without contrast, could be performed for further evaluation as clinically warranted. Electronically Signed   By: Jeannine Boga M.D.   On: 02/20/2022 01:47   DG CHEST PORT 1 VIEW  Result Date: 02/19/2022 CLINICAL DATA:  Atrial fibrillation. EXAM: PORTABLE CHEST 1 VIEW COMPARISON:  Chest radiograph 04/25/2020 and chest CT  08/24/2021 FINDINGS: The cardiac silhouette remains enlarged. Prominence of the thoracic aorta is noted in the setting of a known aortic aneurysm. There is aortic atherosclerosis. Lung volumes are low with mild streaky opacities in the lung bases. No sizable pleural effusion, overt pulmonary edema, or pneumothorax is identified. No acute osseous abnormality is seen. IMPRESSION: Low lung volumes with bibasilar atelectasis. Electronically Signed   By: Logan Bores M.D.   On: 02/19/2022 12:43    Cardiac Studies:  Assessment/Plan:  Chronic A-fib with controlled ventricular response COPD HTN AA Aneurysm Type 2 DM Status post right hip fracture Plan DC IV amiodarone when present bag is complete Start amiodarone 200 mg p.o. daily Dr. Doylene Canard will follow from a.m. Encourage ambulation as tolerated    LOS: 5 days    Phillip West 02/21/2022, 7:21 AM

## 2022-02-21 NOTE — Progress Notes (Signed)
HD#5 Subjective:   Summary: Phillip West is a 88 yo M with a PMH of HTN, HLD, chronic atrial fibrillation, thoracic/abdominal aortic aneurysm (ascending aorta 5.9cm) c/b moderate aortic valve insufficiency, tobacco use disorder, COPD, h/o L femur fracture 2021, and T2DM p/w fall and R hip fx POD 2 s\p IM Intratrochanteric nail.   Overnight Events: NAEO.  Patient states he continues to improve regarding pain after hip surgery. He does still have some dizziness as he sits in his bed. Sat in chair for a while the other day but says it was very uncomfortable, not just with hip but in general. Said he would try again. Still frustrated with PT about moving his leg too quickly. We discussed the importance of getting OOB and moving extremities to improve healing.  Objective:  Vital signs in last 24 hours: Vitals:   02/21/22 0008 02/21/22 0342 02/21/22 0718 02/21/22 0755  BP:  107/66 110/60   Pulse:  77 69   Resp:  18 18   Temp:  98.7 F (37.1 C) 97.6 F (36.4 C)   TempSrc:  Oral Oral   SpO2:  95% 95% 96%  Weight: 67.2 kg     Height:       Supplemental O2: Nasal Cannula SpO2: 96 % O2 Flow Rate (L/min): 2 L/min   Physical Exam:  Constitutional: Well-developed, well-nourished, and in no distress.  HENT:  Head: Normocephalic and atraumatic.  Cardiovascular: Regular rate, irregular rhythm.  No M/R/G. Pulmonary: Non labored breathing on 2L [place] Extremities: RLE dressing clean dry and intact. No ecchymosis. Rt DP pulse 2+. Psych: Normal mood and affect.   Filed Weights   02/16/22 1059 02/20/22 0415 02/21/22 0008  Weight: 68 kg 62.8 kg 67.2 kg     Intake/Output Summary (Last 24 hours) at 02/21/2022 0928 Last data filed at 02/21/2022 0900 Gross per 24 hour  Intake 1315.92 ml  Output 1325 ml  Net -9.08 ml   Net IO Since Admission: 178.69 mL [02/21/22 0928]  Pertinent Labs:    Latest Ref Rng & Units 02/21/2022   12:44 AM 02/20/2022    6:06 AM 02/19/2022    7:42 AM  CBC   WBC 4.0 - 10.5 K/uL 9.8  8.6  10.0   Hemoglobin 13.0 - 17.0 g/dL 9.7  9.8  10.4   Hematocrit 39.0 - 52.0 % 28.3  30.1  30.8   Platelets 150 - 400 K/uL 148  130  115        Latest Ref Rng & Units 02/21/2022   12:44 AM 02/20/2022    6:06 AM 02/19/2022    7:42 AM  CMP  Glucose 70 - 99 mg/dL 126  126  112   BUN 8 - 23 mg/dL '12  12  13   '$ Creatinine 0.61 - 1.24 mg/dL 0.81  0.75  0.73   Sodium 135 - 145 mmol/L 132  136  135   Potassium 3.5 - 5.1 mmol/L 3.7  3.7  3.5   Chloride 98 - 111 mmol/L 101  101  105   CO2 22 - 32 mmol/L '24  25  21   '$ Calcium 8.9 - 10.3 mg/dL 8.6  9.0  9.0   Total Protein 6.5 - 8.1 g/dL 5.8  5.5    Total Bilirubin 0.3 - 1.2 mg/dL 1.2  0.8    Alkaline Phos 38 - 126 U/L 47  46    AST 15 - 41 U/L 45  26    ALT 0 -  44 U/L 26  13    TSH 1.715  Imaging: No results found.  Assessment/Plan:   Principal Problem:   Hip fracture (HCC) Active Problems:   Atrial fibrillation, chronic (HCC)   Orthostatic hypotension   Dizziness   Recurrent falls   Patient Summary: Phillip West is a 88 yo M with a PMH of HTN, HLD, chronic atrial fibrillation, thoracic/abdominal aortic aneurysm (ascending aorta 5.9cm) c/b moderate aortic valve insufficiency, tobacco use disorder, COPD, h/o L femur fracture 2021, and T2DM who presented after a fall in the setting of vertiginous symptoms found to have R comminuted intertrochanteric proximal femur fracture s/p operative fixation.   #R comminuted intertrochanteric proximal femur fracture  Patient had a fall after becoming dizzy.  He sustained a right femur fracture. Now s/p open treatment right intertrochanteric fracture with cephalomedullary nail.  Pain well controlled.  Continue to encourage cooperation with PT. -Vitamin D levels normal, calcium within normal limits -Consider DEXA scan in the outpatient setting -Pain control with Tylenol, Norco, morphine as needed -PT/OT  #Fall #Vertiginous symptoms  Orthostatics incomplete as  patient is not able to stand consistently due to pain.  Reports vertigo symptoms even at rest with previous hints exam equivocal.  Etiology possibly central vertigo, BPPV, cardiogenic.  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  This could contribute to imbalance but patient's history seems more consistent with cardiogenic etiology. -Vestibular PT  -Repeat orthostatics once able to stand   #Permanent atrial fibrillation with RVR Patient previously on digoxin as well as metoprolol tartrate for permanent A-fib.  Anticoagulation is precluded by his falls. CHADSvasc score 3. HAS Bled score 2.  Rate controlled today.  Hemodynamically stable. -Continue digoxin to 0.125 mg half tablet daily -Continue metoprolol at 12.5 mg twice daily -Transition Amiodarone to 200 mg daily po  #Chronic hypotension  Patient hemodynamically stable while in A-fib.  -Midodrine 5 mg 3 times daily   #COPD Continue home inhalers and PRN nebs    #T/AAA Evaluated by thoracic surgery recently no plans for intervention currently.  Diet: Normal IVF: None,None VTE: Xarelto 10 mg daily Code: Full PT/OT recs: SNF TOC recs: Resources not needed at this time.  Dispo: Anticipated discharge to SNF pending resolution of A-fib with RVR and further medical evaluation  Linus Galas, MD PGY1 Internal Medicine [phone]

## 2022-02-21 NOTE — Progress Notes (Addendum)
Subjective: Currently feels well. No dizziness or light-headedness. Did have an episode earlier this afternoon when he felt like he was going to "doze off," which was unusual because he was in the midst of a conversation with his family.  Objective: HR 59 BP 111/86  On room air  General: No acute distress or discomfort. Cardiovascular: Irregular bradycardia. Pulmonary: Normal work of breathing. Neurologic: Alert without gross focal deficits.  Assessment and plan: 88 yo M with history of Afib w RVR hospitalized for dizziness and fall causing R hip fracture, recently started on amiodarone and now with bradycardia.  Patient is currently asymptomatic, although his report of "dozing off" earlier while in mid-conversation is suspicious. Was started on amiodarone 2 days ago for a recent episode of Afib with RVR. Metoprolol was also restarted this morning. Also on digoxin. On the monitor he is in Afib and bradycardic to the 40-50s. Suspect this patient's bradycardia is secondary to excessive AV nodal blockade.  Will repeat EKG and hold metoprolol and amiodarone for now.

## 2022-02-22 ENCOUNTER — Inpatient Hospital Stay (HOSPITAL_COMMUNITY)
Admission: RE | Admit: 2022-02-22 | Payer: No Typology Code available for payment source | Source: Intra-hospital | Admitting: Physical Medicine and Rehabilitation

## 2022-02-22 ENCOUNTER — Inpatient Hospital Stay (HOSPITAL_COMMUNITY): Payer: No Typology Code available for payment source

## 2022-02-22 DIAGNOSIS — I82402 Acute embolism and thrombosis of unspecified deep veins of left lower extremity: Secondary | ICD-10-CM

## 2022-02-22 LAB — URINALYSIS, ROUTINE W REFLEX MICROSCOPIC
Bacteria, UA: NONE SEEN
Bilirubin Urine: NEGATIVE
Glucose, UA: 150 mg/dL — AB
Ketones, ur: NEGATIVE mg/dL
Nitrite: NEGATIVE
Protein, ur: NEGATIVE mg/dL
Specific Gravity, Urine: 1.016 (ref 1.005–1.030)
pH: 6 (ref 5.0–8.0)

## 2022-02-22 LAB — BASIC METABOLIC PANEL
Anion gap: 10 (ref 5–15)
BUN: 10 mg/dL (ref 8–23)
CO2: 26 mmol/L (ref 22–32)
Calcium: 9.3 mg/dL (ref 8.9–10.3)
Chloride: 102 mmol/L (ref 98–111)
Creatinine, Ser: 0.69 mg/dL (ref 0.61–1.24)
GFR, Estimated: >60 mL/min (ref >60)
Glucose, Bld: 124 mg/dL — ABNORMAL HIGH (ref 70–99)
Potassium: 3.7 mmol/L (ref 3.5–5.1)
Sodium: 138 mmol/L (ref 135–145)

## 2022-02-22 LAB — CBC
HCT: 27.7 % — ABNORMAL LOW (ref 39.0–52.0)
Hemoglobin: 9.6 g/dL — ABNORMAL LOW (ref 13.0–17.0)
MCH: 33.1 pg (ref 26.0–34.0)
MCHC: 34.7 g/dL (ref 30.0–36.0)
MCV: 95.5 fL (ref 80.0–100.0)
Platelets: 175 10*3/uL (ref 150–400)
RBC: 2.9 MIL/uL — ABNORMAL LOW (ref 4.22–5.81)
RDW: 13.8 % (ref 11.5–15.5)
WBC: 8.5 10*3/uL (ref 4.0–10.5)
nRBC: 0 % (ref 0.0–0.2)

## 2022-02-22 LAB — MAGNESIUM: Magnesium: 2 mg/dL (ref 1.7–2.4)

## 2022-02-22 MED ORDER — AMIODARONE HCL 200 MG PO TABS
200.0000 mg | ORAL_TABLET | Freq: Every day | ORAL | Status: DC
  Administered 2022-02-22 – 2022-02-24 (×3): 200 mg via ORAL
  Filled 2022-02-22 (×3): qty 1

## 2022-02-22 MED ORDER — POTASSIUM CHLORIDE 20 MEQ PO PACK
40.0000 meq | PACK | Freq: Once | ORAL | Status: AC
  Administered 2022-02-22: 40 meq via ORAL
  Filled 2022-02-22: qty 2

## 2022-02-22 NOTE — TOC Initial Note (Signed)
Transition of Care [facility]) - Initial/Assessment Note    Patient Details  Name: Phillip West MRN: [id number] Date of Birth: 30-Oct-1929  Transition of Care [facility] CM/SW Contact:    Bethann Berkshire, [place] Phone Number: 02/22/2022, 11:07 AM  Clinical Narrative:      CSW met with pt to discuss disposition. CIR will follow and though was initially pt not at a level to be appropriate for CIR. CSW explained current recommendation for SNF. Pt reports he has been to a rehab before in [facility] but would prefer to be closer to [place]. Upon chart review, he has been to [facility] in the past. Pt currently lives at his home in [place]. Son lives there as well and pt reports that son works. Pt agreeable to SNF workup. He confirms that he has Medicare. Per chart review, most recent medicare card scanned, pt has traditional medicare. CSW explained medicare SNF coverage. Pt is agreeable to SNF workup. CSW completed fl2 and faxed bed requests in the hub.                Expected Discharge Plan: Skilled Nursing Facility Barriers to Discharge: Continued Medical Work up   Patient Goals and CMS Choice        Expected Discharge Plan and Services Expected Discharge Plan: [place]       Living arrangements for the past 2 months: Single Family Home                                      Prior Living Arrangements/Services Living arrangements for the past 2 months: Single Family Home Lives with:: Adult Children                   Activities of Daily Living Home Assistive Devices/Equipment: None ADL Screening (condition at time of admission) Patient's cognitive ability adequate to safely complete daily activities?: Yes Is the patient deaf or have difficulty hearing?: No Does the patient have difficulty seeing, even when wearing glasses/contacts?: No Does the patient have difficulty concentrating, remembering, or making decisions?: No Patient able to express need for  assistance with ADLs?: No Does the patient have difficulty dressing or bathing?: No Independently performs ADLs?: Yes (appropriate for developmental age) Does the patient have difficulty walking or climbing stairs?: Yes Weakness of Legs: None Weakness of Arms/Hands: Left  Permission Sought/Granted                  Emotional Assessment Appearance:: Appears stated age Attitude/Demeanor/Rapport: Engaged Affect (typically observed): Accepting, Adaptable Orientation: : Oriented to Self, Oriented to Place, Oriented to  Time, Oriented to Situation Alcohol / Substance Use: Not Applicable Psych Involvement: No (comment)  Admission diagnosis:  Hip fracture ([place]) [S72.009A] Patient Active Problem List   Diagnosis Date Noted   Dizziness 02/17/2022   Recurrent falls 02/17/2022   Cardiomegaly 02/16/2022   Hip fracture ([place]) 02/16/2022   Other abnormalities of gait and mobility 03/18/2021   Bilateral pseudophakia 03/18/2021   Encounter for fitting and adjustment of hearing aid 03/18/2021   Encounter for other administrative examinations 03/18/2021   Hematuria 03/18/2021   Impacted cerumen, left ear 03/18/2021   Longstanding persistent atrial fibrillation ([place]) 03/18/2021   Nail dystrophy 03/18/2021   Need for assistance with personal care 03/18/2021   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   Pain in left hip 03/18/2021   Personal history of (healed)  traumatic fracture 03/18/2021   Presbyopia 03/18/2021   Sensorineural hearing loss, bilateral 03/18/2021   Sudden idiopathic hearing loss, left ear 03/18/2021   Syncope and collapse 03/18/2021   Type 2 diabetes mellitus without complication ([place]) 22/48/2500   Closed left hip fracture, initial encounter ([place])    Orthostatic hypotension    Femur fracture, left ([place]) 04/25/2020   Thoracic aortic aneurysm (TAA) ([place])    Chalazion right lower eyelid    Hypertension    Gross hematuria    Diabetes mellitus ([place])    Atrial fibrillation  ([place])    Essential hypertension 07/14/2018   Hyperlipidemia 07/14/2018   Atrial fibrillation, chronic ([place]) 07/14/2018   Abdominal aortic aneurysm (AAA) ([place]) 07/14/2018   Tobacco abuse 07/14/2018   COPD (chronic obstructive pulmonary disease) ([place]) 07/14/2018   PCP:  Windy Fast, MD Pharmacy:   [place], [place] - [address] [place] [address] Phone: [phone] Fax: [phone]     Social Determinants of Health (SDOH) Interventions Housing Interventions: Intervention Not Indicated  Readmission Risk Interventions    04/28/2020   12:12 PM  Readmission Risk Prevention Plan  Post Dischage Appt Complete  Medication Screening Complete  Transportation Screening Complete

## 2022-02-22 NOTE — Progress Notes (Signed)
Physical Therapy Treatment Patient Details Name: SHERIFF RODENBERG MRN: [id number] DOB: 05-14-1930 Today's Date: 02/22/2022   History of Present Illness 88 y/o male presented to ED on 02/16/22 after fall at mailbox. Sustained R peritrochanteric fx. S/p R femur IM nail on 10/3. PMH includes abdominal aortic aneurysms, COPD, chronic Afib, T2DM, h/o L femur fx 2021    PT Comments    Pt was seen for progressing his mobility on RW from side of bed to chair, with better controlled steps to reach the recliner.  Pt is distracted by discussion with MD about his symptoms, and did receive request from MD to have a vestibular PT see him to clear that as source of his dizziness, none of which was reported during therapy session today.  No nystagmus or other vestibular symptoms were observed.  Follow acutely for goals of PT as are outlined on POC.   Recommendations for follow up therapy are one component of a multi-disciplinary discharge planning process, led by the attending physician.  Recommendations may be updated based on patient status, additional functional criteria and insurance authorization.  Follow Up Recommendations    Can patient physically be transported by private vehicle: No   Assistance Recommended at Discharge Frequent or constant Supervision/Assistance  Patient can return home with the following A lot of help with walking and/or transfers;A little help with bathing/dressing/bathroom;Assistance with cooking/housework;Direct supervision/assist for medications management;Direct supervision/assist for financial management;Assist for transportation;Help with stairs or ramp for entrance   Equipment Recommendations  Rolling walker (2 wheels);BSC/3in1    Recommendations for Other Services OT consult     Precautions / Restrictions Precautions Precautions: Fall Precaution Comments: dizziness, L wrist fx (12/2021) - patient states able to bear weight through wrist Required Braces or Orthoses:  Other Brace Restrictions Weight Bearing Restrictions: No Other Position/Activity Restrictions: pt is reporting he is permitted to WB on LUE wrist     Mobility  Bed Mobility Overal bed mobility: Needs Assistance Bed Mobility: Rolling, Sidelying to Sit Rolling: Mod assist   Supine to sit: Mod assist          Transfers Overall transfer level: Needs assistance Equipment used: Rolling walker (2 wheels) Transfers: Sit to/from Stand, Bed to chair/wheelchair/BSC Sit to Stand: Mod assist   Step pivot transfers: Mod assist       General transfer comment: pt requires repeated reminders for hand placement and then to sequence sitting on chair bedside    Ambulation/Gait               General Gait Details: steps for transfers to chair only   Stairs             Wheelchair Mobility    Modified Rankin (Stroke Patients Only)       Balance Overall balance assessment: Needs assistance Sitting-balance support: Feet supported Sitting balance-Leahy Scale: Fair     Standing balance support: Bilateral upper extremity supported, During functional activity Standing balance-Leahy Scale: Poor                              Cognition Arousal/Alertness: Awake/alert Behavior During Therapy: WFL for tasks assessed/performed Overall Cognitive Status: Within Functional Limits for tasks assessed                                          Exercises  General Comments General comments (skin integrity, edema, etc.): pt was seen for progressing to chair with help on RW, but is distracted by conversation about his dizziness.  MD is wishing to get vestibular eval done      Pertinent Vitals/Pain Pain Assessment Pain Assessment: Faces Faces Pain Scale: Hurts little more Pain Location: R hip Pain Descriptors / Indicators: Guarding, Grimacing Pain Intervention(s): Monitored during session, Repositioned, Limited activity within patient's tolerance,  Premedicated before session    Home Living                          Prior Function            PT Goals (current goals can now be found in the care plan section) Acute Rehab PT Goals Patient Stated Goal: to be able to walk alone Progress towards PT goals: Progressing toward goals    Frequency    Min 4X/week      PT Plan Current plan remains appropriate    Co-evaluation              AM-PAC PT "6 Clicks" Mobility   Outcome Measure  Help needed turning from your back to your side while in a flat bed without using bedrails?: A Lot Help needed moving from lying on your back to sitting on the side of a flat bed without using bedrails?: A Lot Help needed moving to and from a bed to a chair (including a wheelchair)?: Total Help needed standing up from a chair using your arms (e.g., wheelchair or bedside chair)?: Total Help needed to walk in hospital room?: Total Help needed climbing 3-5 steps with a railing? : Total 6 Click Score: 8    End of Session Equipment Utilized During Treatment: Oxygen;Gait belt Activity Tolerance: Patient limited by fatigue;Treatment limited secondary to medical complications (Comment) Patient left: with call bell/phone within reach;in chair;with chair alarm set Nurse Communication: Mobility status PT Visit Diagnosis: Muscle weakness (generalized) (M62.81);Unsteadiness on feet (R26.81);Difficulty in walking, not elsewhere classified (R26.2);History of falling (Z91.81)     Time: 6761-9509 PT Time Calculation (min) (ACUTE ONLY): 29 min  Charges:  $Gait Training: 8-22 mins $Therapeutic Activity: 8-22 mins    Ramond Dial 02/22/2022, 3:22 PM  Mee Hives, PT PhD Acute Rehab Dept. Number: [place] and [place]

## 2022-02-22 NOTE — Consult Note (Signed)
Ref: Windy Fast, MD   Subjective:  Heart rate in 90-110 as he is off metoprolol and amiodarone. Awaiting rehab if approved. Monitor - atrial fibrillation continues.  Objective:  Vital Signs in the last 24 hours: Temp:  [97.7 F (36.5 C)-98.1 F (36.7 C)] 98.1 F (36.7 C) (10/09 1051) Pulse Rate:  [45-100] 94 (10/09 1051) Cardiac Rhythm: Atrial fibrillation (10/09 0700) Resp:  [15-22] 20 (10/09 1051) BP: (99-116)/(56-86) 115/66 (10/09 1051) SpO2:  [93 %-100 %] 96 % (10/09 1051) Weight:  [66.9 kg] 66.9 kg (10/09 0353)  Physical Exam: BP Readings from Last 1 Encounters:  02/22/22 115/66     Wt Readings from Last 1 Encounters:  02/22/22 66.9 kg    Weight change: -0.3 kg Body mass index is 22.43 kg/m. HEENT: [place]/AT, Eyes-Blue, Conjunctiva-Pale pink, Sclera-Non-icteric Neck: No JVD, No bruit, Trachea midline. Lungs:  Clear, Bilateral. Cardiac:  Irregular rhythm, normal S1 and S2, no S3. II/VI systolic murmur. Abdomen:  Soft, non-tender. BS present. Extremities:  No edema present. No cyanosis. No clubbing. CNS: AxOx3, Cranial nerves grossly intact, moves all 4 extremities.  Skin: Warm and dry.   Intake/Output from previous day: 10/08 0701 - 10/09 0700 In: 417 [P.O.:417] Out: 1075 [Urine:1075]    Lab Results: BMET    Component Value Date/Time   NA 138 02/22/2022 0347   NA 132 (L) 02/21/2022 0044   NA 136 02/20/2022 0606   K 3.7 02/22/2022 0347   K 3.7 02/21/2022 0044   K 3.7 02/20/2022 0606   CL 102 02/22/2022 0347   CL 101 02/21/2022 0044   CL 101 02/20/2022 0606   CO2 26 02/22/2022 0347   CO2 24 02/21/2022 0044   CO2 25 02/20/2022 0606   GLUCOSE 124 (H) 02/22/2022 0347   GLUCOSE 126 (H) 02/21/2022 0044   GLUCOSE 126 (H) 02/20/2022 0606   BUN 10 02/22/2022 0347   BUN 12 02/21/2022 0044   BUN 12 02/20/2022 0606   CREATININE 0.69 02/22/2022 0347   CREATININE 0.81 02/21/2022 0044   CREATININE 0.75 02/20/2022 0606   CREATININE 0.87 06/15/2019 0855    CALCIUM 9.3 02/22/2022 0347   CALCIUM 8.6 (L) 02/21/2022 0044   CALCIUM 9.0 02/20/2022 0606   GFRNONAA >60 02/22/2022 0347   GFRNONAA >60 02/21/2022 0044   GFRNONAA >60 02/20/2022 0606   CBC    Component Value Date/Time   WBC 8.5 02/22/2022 0347   RBC 2.90 (L) 02/22/2022 0347   HGB 9.6 (L) 02/22/2022 0347   HCT 27.7 (L) 02/22/2022 0347   PLT 175 02/22/2022 0347   MCV 95.5 02/22/2022 0347   MCH 33.1 02/22/2022 0347   MCHC 34.7 02/22/2022 0347   RDW 13.8 02/22/2022 0347   LYMPHSABS 1.3 02/16/2022 1215   MONOABS 0.7 02/16/2022 1215   EOSABS 0.2 02/16/2022 1215   BASOSABS 0.1 02/16/2022 1215   HEPATIC Function Panel Recent Labs    02/18/22 0220 02/20/22 0606 02/21/22 0044  PROT 5.7* 5.5* 5.8*  ALBUMIN 3.3* 2.9* 2.8*  AST 28 26 45*  ALT '12 13 26  '$ ALKPHOS 38 46 47   HEMOGLOBIN A1C Lab Results  Component Value Date   MPG 136.98 04/25/2020   CARDIAC ENZYMES No results found for: "CKTOTAL", "CKMB", "CKMBINDEX", "TROPONINI" BNP No results for input(s): "PROBNP" in the last 8760 hours. TSH Recent Labs    02/19/22 0742  TSH 1.715   CHOLESTEROL No results for input(s): "CHOL" in the last 8760 hours.  Scheduled Meds:  amiodarone  200 mg Oral Daily  digoxin  0.0625 mg Oral Daily   docusate sodium  100 mg Oral BID   fluticasone furoate-vilanterol  1 puff Inhalation Daily   midodrine  5 mg Oral TID WC   potassium chloride  40 mEq Oral Once   pravastatin  40 mg Oral QHS   rivaroxaban  10 mg Oral Q breakfast   Continuous Infusions: PRN Meds:.acetaminophen, HYDROcodone-acetaminophen, HYDROcodone-acetaminophen, ipratropium-albuterol, menthol-cetylpyridinium **OR** phenol, morphine injection, ondansetron **OR** ondansetron (ZOFRAN) IV  Assessment/Plan: Atrial fibrillation with moderate ventricular control COPD HTN AA aneurysm Type 2 DM S/P right hip fracture with surgery  Plan: Hold metoprolol. Resume PO amiodarone at 200 mg. Daily instead of 400 mg. and reduce  to 100 mg. daily after 1-2 months.   LOS: 6 days   Time spent including chart review, lab review, examination, discussion with patient/Family/Nurse : 30 min   Dixie Dials  MD  02/22/2022, 2:27 PM

## 2022-02-22 NOTE — NC FL2 (Addendum)
[place] LEVEL OF CARE SCREENING TOOL     IDENTIFICATION  Patient Name: Phillip West Birthdate: [birth year] Sex: male Admission Date (Current Location): 02/16/2022  [facility] and [place] Number:  Herbalist and Address:  [facility], [place] [address], [place], [place] [address]      Provider Number: [id number]  Attending Physician Name and Address:  Angelica Pou, MD  Relative Name and Phone Number:  [place], [place]man [facility])   [phone] [facility])    Current Level of Care: Hospital Recommended Level of Care: [place] Prior Approval Number:    Date Approved/Denied:   PASRR Number: [id number]  Discharge Plan: SNF    Current Diagnoses: Patient Active Problem List   Diagnosis Date Noted   Dizziness 02/17/2022   Recurrent falls 02/17/2022   Cardiomegaly 02/16/2022   Hip fracture ([place]) 02/16/2022   Other abnormalities of gait and mobility 03/18/2021   Bilateral pseudophakia 03/18/2021   Encounter for fitting and adjustment of hearing aid 03/18/2021   Encounter for other administrative examinations 03/18/2021   Hematuria 03/18/2021   Impacted cerumen, left ear 03/18/2021   Longstanding persistent atrial fibrillation ([place]) 03/18/2021   Nail dystrophy 03/18/2021   Need for assistance with personal care 03/18/2021   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   Pain in left hip 03/18/2021   Personal history of (healed) traumatic fracture 03/18/2021   Presbyopia 03/18/2021   Sensorineural hearing loss, bilateral 03/18/2021   Sudden idiopathic hearing loss, left ear 03/18/2021   Syncope and collapse 03/18/2021   Type 2 diabetes mellitus without complication ([place]) 54/62/7035   Closed left hip fracture, initial encounter ([place])    Orthostatic hypotension    Femur fracture, left ([place]) 04/25/2020   Thoracic aortic aneurysm (TAA) ([place])    [place] right lower eyelid    Hypertension    Gross hematuria     Diabetes mellitus ([place])    Atrial fibrillation ([place])    Essential hypertension 07/14/2018   Hyperlipidemia 07/14/2018   Atrial fibrillation, chronic ([place]) 07/14/2018   Abdominal aortic aneurysm (AAA) ([place]) 07/14/2018   Tobacco abuse 07/14/2018   COPD (chronic obstructive pulmonary disease) ([place]) 07/14/2018    Orientation RESPIRATION BLADDER Height & Weight     Self, Situation, Time, Place  Normal Continent Weight: 147 lb 7.8 oz (66.9 kg) Height:  '5\' 8"'$  (172.7 cm)  BEHAVIORAL SYMPTOMS/MOOD NEUROLOGICAL BOWEL NUTRITION STATUS      Continent Diet (see d/c summary)  AMBULATORY STATUS COMMUNICATION OF NEEDS Skin   Extensive Assist Verbally Surgical wounds (incision, closed, right hip)                       Personal Care Assistance Level of Assistance  Bathing, Feeding, Dressing Bathing Assistance: Maximum assistance Feeding assistance: Independent Dressing Assistance: Maximum assistance     Functional Limitations Info  Sight, Hearing, Speech Sight Info: Impaired Hearing Info: Adequate Speech Info: Adequate    SPECIAL CARE FACTORS FREQUENCY  PT (By licensed PT), OT (By licensed OT)     PT Frequency: 5x/week OT Frequency: 5x/week            Contractures Contractures Info: Not present    Additional Factors Info  Code Status, Allergies Code Status Info: Full code Allergies Info: Quinolones, Ciprofloxacin           Current Medications (02/22/2022):  This is the current hospital active medication list Current Facility-Administered Medications  Medication Dose Route  Frequency Provider Last Rate Last Admin   acetaminophen (TYLENOL) tablet 325-650 mg  325-650 mg Oral Q6H PRN Georgeanna Harrison, MD       digoxin (LANOXIN) tablet 0.0625 mg  0.0625 mg Oral Daily Charolette Forward, MD   0.0625 mg at 02/22/22 0800   docusate sodium (COLACE) capsule 100 mg  100 mg Oral BID Georgeanna Harrison, MD   100 mg at 02/22/22 0800   fluticasone furoate-vilanterol (BREO ELLIPTA) 200-25  MCG/ACT 1 puff  1 puff Inhalation Daily Rick Duff, MD   1 puff at 02/22/22 0801   HYDROcodone-acetaminophen (NORCO) 7.5-325 MG per tablet 1-2 tablet  1-2 tablet Oral Q4H PRN Georgeanna Harrison, MD   1 tablet at 02/19/22 2205   HYDROcodone-acetaminophen (NORCO/VICODIN) 5-325 MG per tablet 1-2 tablet  1-2 tablet Oral Q4H PRN Georgeanna Harrison, MD       ipratropium-albuterol (DUONEB) 0.5-2.5 (3) MG/3ML nebulizer solution 3 mL  3 mL Nebulization Q4H PRN Rick Duff, MD       menthol-cetylpyridinium (CEPACOL) lozenge 3 mg  1 lozenge Oral PRN Georgeanna Harrison, MD       Or   phenol (CHLORASEPTIC) mouth spray 1 spray  1 spray Mouth/Throat PRN Georgeanna Harrison, MD       midodrine (PROAMATINE) tablet 5 mg  5 mg Oral TID WC Rick Duff, MD   5 mg at 02/22/22 0758   morphine (PF) 2 MG/ML injection 0.5-1 mg  0.5-1 mg Intravenous Q2H PRN Virl Axe, MD       ondansetron [facility]) tablet 4 mg  4 mg Oral Q6H PRN Georgeanna Harrison, MD       Or   ondansetron [facility]) injection 4 mg  4 mg Intravenous Q6H PRN Georgeanna Harrison, MD       pravastatin (PRAVACHOL) tablet 40 mg  40 mg Oral QHS Rick Duff, MD   40 mg at 02/21/22 2152   rivaroxaban (XARELTO) tablet 10 mg  10 mg Oral Q breakfast Pham, Minh Q, RPH-CPP   10 mg at 02/22/22 3149     Discharge Medications: Please see discharge summary for a list of discharge medications.  Relevant Imaging Results:  Relevant Lab Results:   Additional Information SSN [id number] Pt has Traditional Medicare A and B as well.   [place], LCSW

## 2022-02-22 NOTE — Progress Notes (Addendum)
Reviewed last 24 hrs with Dr. Dema Severin while he was on the unit.    02/22/23 PT had infiltrated Amiodarone, infusion changed to alternate site. Oral medications given.  PT started to have episodes of brady cardia, RN stopped infusion and reviewed tele strips. Paged Dr. Carin Primrose. Provider and team came to bedside to assess and drip was discontinued. This RN expressed concern that this is contributing to PT's falls over the last two years. Provider stated he would hold some of the PT's medications in the evening.   PT's bradycardia worsened.  Dr. Carin Primrose re-paged at 1550.  HR in 30's and 40's consistently. Provider returned to bedside and re-assessed PT. Again this RN discussed dizziness and concern as contributing factor to falls at home. Provider told this RN that PT described vertigo type dizziness when tilting head back for his eye drop administration that would affect him all day.  Today, without eye drop administration, PT told provider, with this RN in room, that he had a "staring off into space spell" when his son was visiting earlier in the afternoon. PT was not out of bed due to bradycardia.  Also having soft blood pressures. PT on Midodrine tid with meals. Systolic BP frequently in the 90s during the day.  Dr. Carin Primrose again stated he would be holding evening medications.  During shift report this morning, night RN Ashly, told this RN that the PT continued to have episodes of bradycardia and what appeared to be missed beats.  She stated that this was happening both when he was asleep and when he was awake.  She also stated when awake he was verbalizing dizziness.   This information all provided to Internal Medicine team rounding this morning. This RN expressed that possible bradycardia, hypotension, and or vtach/afib causing dizziness contributing to patient falls over the last two years resulting in multiple fractures.   [place] with Dr. Dema Severin regarding hematuria.  Urine had been darker,  but now red with small blood clot. PT denies pain and states, "I am old and this happens at home. I go and then I think I am done and then have to go again." Also states it takes him time when he does have to go.

## 2022-02-22 NOTE — Progress Notes (Cosign Needed Addendum)
HD#6 Subjective:   Summary: [name] is a 88 yo M with a PMH of HTN, HLD, chronic atrial fibrillation, thoracic/abdominal aortic aneurysm (ascending aorta 5.9cm) c/b moderate aortic valve insufficiency, tobacco use disorder, COPD, h/o L femur fracture 2021, and T2DM p/w fall and R hip fx POD 2 s\p IM Intratrochanteric nail.   Overnight Events: Episode of bradycardia but asymptomatic. Irregular rhythm. Held metoprolol and amio po. Patient's rate controlled this morning.  Patient states that during his episodes of bradycardia last night he did have some dizziness.  Denies palpitations, blurred vision, chest pain at that time.  States that sometimes at home his episodes will start with brief dizziness and then lead to "blacking out."Other times he will not have any dizziness.  Endorses times having a twinge of left-sided chest pain but has not had this in several weeks.  He states that he saw a cardiologist who discussed both pacemaker but also ablation.  Ultimately did not go through with this he states because of his age.  Also saw outpatient provider for inner ear issues.  Removed cerumen impaction.  Endorses history of bilateral tinnitus.  States that at 1 point he woke up with some fluid draining from his left ear but this was 1 time in the distant past.  Objective:  Vital signs in last 24 hours: Vitals:   02/22/22 0353 02/22/22 0720 02/22/22 0800 02/22/22 1051  BP: 112/62 (!) 99/56  115/66  Pulse: 75 100 91 94  Resp: '18 17  20  '$ Temp: 97.7 F (36.5 C) 97.9 F (36.6 C)  98.1 F (36.7 C)  TempSrc: Oral Oral  Oral  SpO2: 95% 93%  96%  Weight: 66.9 kg     Height:       Supplemental O2: Nasal Cannula SpO2: 96 % O2 Flow Rate (L/min): 2 L/min   Physical Exam:  Constitutional: Well-developed, well-nourished, and in no distress.  Sitting in chair. HENT: EOMI. Head: Normocephalic and atraumatic.  Cardiovascular: Irregular rate, irregular rhythm.  No M/R/G. Pulmonary: Non  labored breathing on RA Extremities: RLE dressing C/D/I. No ecchymosis. Psych: Normal mood and affect.   Filed Weights   02/20/22 0415 02/21/22 0008 02/22/22 0353  Weight: 62.8 kg 67.2 kg 66.9 kg     Intake/Output Summary (Last 24 hours) at 02/22/2022 1320 Last data filed at 02/22/2022 1259 Gross per 24 hour  Intake 297 ml  Output 500 ml  Net -203 ml   Net IO Since Admission: -194.31 mL [02/22/22 1320]  Pertinent Labs:    Latest Ref Rng & Units 02/22/2022    3:47 AM 02/21/2022   12:44 AM 02/20/2022    6:06 AM  CBC  WBC 4.0 - 10.5 K/uL 8.5  9.8  8.6   Hemoglobin 13.0 - 17.0 g/dL 9.6  9.7  9.8   Hematocrit 39.0 - 52.0 % 27.7  28.3  30.1   Platelets 150 - 400 K/uL 175  148  130        Latest Ref Rng & Units 02/22/2022    3:47 AM 02/21/2022   12:44 AM 02/20/2022    6:06 AM  CMP  Glucose 70 - 99 mg/dL 124  126  126   BUN 8 - 23 mg/dL '10  12  12   '$ Creatinine 0.61 - 1.24 mg/dL 0.69  0.81  0.75   Sodium 135 - 145 mmol/L 138  132  136   Potassium 3.5 - 5.1 mmol/L 3.7  3.7  3.7   Chloride  98 - 111 mmol/L 102  101  101   CO2 22 - 32 mmol/L '26  24  25   '$ Calcium 8.9 - 10.3 mg/dL 9.3  8.6  9.0   Total Protein 6.5 - 8.1 g/dL  5.8  5.5   Total Bilirubin 0.3 - 1.2 mg/dL  1.2  0.8   Alkaline Phos 38 - 126 U/L  47  46   AST 15 - 41 U/L  45  26   ALT 0 - 44 U/L  26  13     Imaging: No results found.  Assessment/Plan:   Principal Problem:   Hip fracture (HCC) Active Problems:   Atrial fibrillation, chronic (HCC)   Orthostatic hypotension   Dizziness   Recurrent falls   Patient Summary: [name] is a 88 yo M with a PMH of HTN, HLD, chronic atrial fibrillation, thoracic/abdominal aortic aneurysm (ascending aorta 5.9cm) c/b moderate aortic valve insufficiency, tobacco use disorder, COPD, h/o L femur fracture 2021, and T2DM who presented after a fall in the setting of vertiginous symptoms found to have R comminuted intertrochanteric proximal femur fracture s/p operative  fixation.   #R comminuted intertrochanteric proximal femur fracture  Patient had a fall after becoming dizzy.  He sustained a right femur fracture. Now s/p open treatment right intertrochanteric fracture with cephalomedullary nail.  Pain well controlled.  Encouraged to see patient working with physical therapy today. -Vitamin D levels normal, calcium within normal limits -Consider DEXA scan in the outpatient setting -Pain control with Tylenol, Norco, morphine as needed -PT/OT  #Fall #Vertiginous symptoms  Orthostatics incomplete as patient is not able to stand consistently due to pain but pressure increased from lying to sitting. Reports vertigo symptoms even at rest with previous hints exam equivocal.  Etiology possibly central vertigo, BPPV, cardiogenic, or orthostatic hypotension.  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  This could contribute to imbalance but patient's history seems more consistent with cardiogenic etiology. -Vestibular PT to evaluate for peripheral vertigo -Repeat orthostatics, Romberg once able to stand   #Permanent atrial fibrillation with RVR Patient previously on digoxin as well as metoprolol tartrate for permanent A-fib.  Anticoagulation is precluded by his falls. CHADSvasc score 3. HAS Bled score 2.  Patient with possibly symptomatic bradycardia to the 30s, 40s yesterday.  Held amiodarone and metoprolol.  Rate now intermittently uncontrolled with A-fib with RVR.  Hemodynamically stable otherwise.  Review of telemetry indicates brief episodes of A-fib with slow ventricular response.  Continue holding AV nodal blocking agents. -Cardiology following, appreciate recommendations -Continue digoxin to 0.125 mg half tablet daily -Held metoprolol at 12.5 mg twice daily -Held Amiodarone to 200 mg daily po -Supplement magnesium, goal K+ >4  #Chronic hypotension  Soft pressures.  Continue with blood pressure support. -Midodrine 5 mg 3 times daily   #COPD Continue  home inhalers and PRN nebs    #T/AAA Evaluated by thoracic surgery recently no plans for intervention currently.  Diet: Normal IVF: None,None VTE: Xarelto 10 mg daily Code: Full PT/OT recs: SNF TOC recs: Resources not needed at this time.  Dispo: Anticipated discharge to SNF pending resolution of A-fib with RVR and further medical evaluation  Linus Galas, MD PGY1 Internal Medicine [phone]

## 2022-02-22 NOTE — Progress Notes (Signed)
VASCULAR LAB    Left upper extremity venous duplex has been performed.  See CV proc for preliminary results.   Renato Spellman, RVT 02/22/2022, 3:15 PM

## 2022-02-23 LAB — CBC
HCT: 28.3 % — ABNORMAL LOW (ref 39.0–52.0)
Hemoglobin: 9.9 g/dL — ABNORMAL LOW (ref 13.0–17.0)
MCH: 33.2 pg (ref 26.0–34.0)
MCHC: 35 g/dL (ref 30.0–36.0)
MCV: 95 fL (ref 80.0–100.0)
Platelets: 213 10*3/uL (ref 150–400)
RBC: 2.98 MIL/uL — ABNORMAL LOW (ref 4.22–5.81)
RDW: 14 % (ref 11.5–15.5)
WBC: 9.2 10*3/uL (ref 4.0–10.5)
nRBC: 0 % (ref 0.0–0.2)

## 2022-02-23 LAB — BASIC METABOLIC PANEL
Anion gap: 9 (ref 5–15)
BUN: 12 mg/dL (ref 8–23)
CO2: 23 mmol/L (ref 22–32)
Calcium: 9.1 mg/dL (ref 8.9–10.3)
Chloride: 102 mmol/L (ref 98–111)
Creatinine, Ser: 0.72 mg/dL (ref 0.61–1.24)
GFR, Estimated: >60 mL/min (ref >60)
Glucose, Bld: 122 mg/dL — ABNORMAL HIGH (ref 70–99)
Potassium: 4 mmol/L (ref 3.5–5.1)
Sodium: 134 mmol/L — ABNORMAL LOW (ref 135–145)

## 2022-02-23 MED ORDER — ENOXAPARIN SODIUM 40 MG/0.4ML IJ SOSY
40.0000 mg | PREFILLED_SYRINGE | Freq: Every day | INTRAMUSCULAR | Status: DC
  Administered 2022-02-23 – 2022-02-24 (×2): 40 mg via SUBCUTANEOUS
  Filled 2022-02-23 (×2): qty 0.4

## 2022-02-23 MED ORDER — DILTIAZEM HCL 30 MG PO TABS: 30.0000 mg | ORAL_TABLET | Freq: Two times a day (BID) | ORAL | Status: DC

## 2022-02-23 MED ORDER — DILTIAZEM HCL 30 MG PO TABS
30.0000 mg | ORAL_TABLET | Freq: Two times a day (BID) | ORAL | Status: DC
  Administered 2022-02-23 – 2022-02-24 (×3): 30 mg via ORAL
  Filled 2022-02-23 (×3): qty 1

## 2022-02-23 NOTE — Progress Notes (Signed)
Occupational Therapy Treatment Patient Details Name: Phillip West MRN: [id number] DOB: November 27, 1929 Today's Date: 02/23/2022   History of present illness 88 y/o male presented to ED on 02/16/22 after fall at mailbox. Sustained R peritrochanteric fx. S/p R femur IM nail on 10/3. PMH includes abdominal aortic aneurysms, COPD, chronic Afib, T2DM, h/o L femur fx 2021   OT comments  Patient received in supine and agreeable to OT session. Patient required increased time to get to EOB with assistance for RLE. Patient asked to sit on EOB for increased time to prepare for transfer to recliner.  Patient was mod assist to stand and step pivot to recliner with RW and increased time. Patient performed grooming and UB bathing seated in recliner. Patient states he would like to go to [place] for rehab instead of skilled nursing. Acute OT to continue to follow.    Recommendations for follow up therapy are one component of a multi-disciplinary discharge planning process, led by the attending physician.  Recommendations may be updated based on patient status, additional functional criteria and insurance authorization.    Follow Up Recommendations  Skilled nursing-short term rehab (<3 hours/day)    Assistance Recommended at Discharge Frequent or constant Supervision/Assistance  Patient can return home with the following  A lot of help with walking and/or transfers;A lot of help with bathing/dressing/bathroom;Assistance with cooking/housework;Assistance with feeding;Assist for transportation   Equipment Recommendations  None recommended by OT (TBD)    Recommendations for Other Services      Precautions / Restrictions Precautions Precautions: Fall Precaution Comments: dizziness, L wrist fx (12/2021) - patient states able to bear weight through wrist Required Braces or Orthoses: Other Brace Other Brace: left wrist brace Restrictions Weight Bearing Restrictions: No Other Position/Activity Restrictions: pt is  reporting he is permitted to WB on LUE wrist       Mobility Bed Mobility Overal bed mobility: Needs Assistance Bed Mobility: Rolling, Sit to Supine Rolling: Min assist   Supine to sit: Mod assist     General bed mobility comments: assistance with RLE to get off the bed. use of bed pad to assist with scooting hips towards EOB    Transfers Overall transfer level: Needs assistance Equipment used: Rolling walker (2 wheels) Transfers: Sit to/from Stand, Bed to chair/wheelchair/BSC Sit to Stand: Mod assist     Step pivot transfers: Mod assist     General transfer comment: Mod assist for safety and to stedy     Balance Overall balance assessment: Needs assistance Sitting-balance support: Feet supported Sitting balance-Leahy Scale: Fair Sitting balance - Comments: able to sit on EOB without UE support   Standing balance support: Bilateral upper extremity supported, During functional activity Standing balance-Leahy Scale: Poor Standing balance comment: reliant on UE support for balance                           ADL either performed or assessed with clinical judgement   ADL Overall ADL's : Needs assistance/impaired     Grooming: Wash/dry hands;Wash/dry face;Set up;Sitting Grooming Details (indicate cue type and reason): in recliner Upper Body Bathing: Supervision/ safety;Sitting Upper Body Bathing Details (indicate cue type and reason): seated in recliner         Lower Body Dressing: Maximal assistance;Total assistance;Cueing for safety;Cueing for sequencing;Bed level Lower Body Dressing Details (indicate cue type and reason): donning socks Toilet Transfer: Moderate assistance Toilet Transfer Details (indicate cue type and reason): simulated to recliner  General ADL Comments: Patient requires increased time    Extremity/Trunk Assessment Upper Extremity Assessment LUE Deficits / Details: Pt has brace for LUE but WBAT LUE Sensation: WNL LUE  Coordination: WNL            Vision       Perception     Praxis      Cognition Arousal/Alertness: Awake/alert Behavior During Therapy: WFL for tasks assessed/performed Overall Cognitive Status: Within Functional Limits for tasks assessed                                          Exercises      Shoulder Instructions       General Comments      Pertinent Vitals/ Pain       Pain Assessment Pain Assessment: Faces Faces Pain Scale: Hurts little more Pain Location: R hip Pain Descriptors / Indicators: Guarding, Grimacing Pain Intervention(s): Limited activity within patient's tolerance, Monitored during session, Repositioned  Home Living                                          Prior Functioning/Environment              Frequency  Min 2X/week        Progress Toward Goals  OT Goals(current goals can now be found in the care plan section)  Progress towards OT goals: Progressing toward goals  Acute Rehab OT Goals Patient Stated Goal: go to [place] OT Goal Formulation: With patient Time For Goal Achievement: 03/04/22 Potential to Achieve Goals: Good ADL Goals Pt Will Perform Upper Body Bathing: with modified independence;sitting Pt Will Perform Lower Body Bathing: with min guard assist;sit to/from stand Pt Will Perform Upper Body Dressing: with modified independence;sitting Pt Will Perform Lower Body Dressing: with min guard assist;sit to/from stand Pt Will Transfer to Toilet: with min guard assist;ambulating  Plan Discharge plan remains appropriate    Co-evaluation                 AM-PAC OT "6 Clicks" Daily Activity     Outcome Measure   Help from another person eating meals?: None Help from another person taking care of personal grooming?: A Little Help from another person toileting, which includes using toliet, bedpan, or urinal?: A Lot Help from another person bathing (including washing, rinsing, drying)?:  A Lot Help from another person to put on and taking off regular upper body clothing?: A Little Help from another person to put on and taking off regular lower body clothing?: A Lot 6 Click Score: 16    End of Session Equipment Utilized During Treatment: Gait belt;Rolling walker (2 wheels)  OT Visit Diagnosis: Unsteadiness on feet (R26.81);Other abnormalities of gait and mobility (R26.89);Repeated falls (R29.6);Muscle weakness (generalized) (M62.81);Pain Pain - Right/Left: Right Pain - part of body: Leg   Activity Tolerance Patient tolerated treatment well   Patient Left in chair;with call bell/phone within reach   Nurse Communication Mobility status        Time: 6720-9470 OT Time Calculation (min): 32 min  Charges: OT General Charges $OT Visit: 1 Visit OT Treatments $Self Care/Home Management : 8-22 mins $Therapeutic Activity: 8-22 mins  Lodema Hong, OTA Acute Rehabilitation Services  Office [phone]   Trixie Dredge 02/23/2022, 9:12 AM

## 2022-02-23 NOTE — Progress Notes (Signed)
HD#7 Subjective:   Summary: Phillip West is a 88 yo M with a PMH of HTN, HLD, chronic atrial fibrillation, thoracic/abdominal aortic aneurysm (ascending aorta 5.9cm) c/b moderate aortic valve insufficiency, tobacco use disorder, COPD, h/o L femur fracture 2021, and T2DM p/w fall and R hip fx POD 2 s\p IM Intratrochanteric nail.   Overnight Events: Painless hematuria yesterday evening.   Patient continues to endorse improvement with hip pain. Not requiring pain medication. Able to ambulate, though he says very slowly (as expected). In chair when seen today. Denies dysuria, flank pain, suprapubic pain, changes in frequency or urge. States his hematuria is a known problem of his that his outpatient urologist decided not to follow up on any more. He last saw urology a few years ago. Hematuria comes and goes at baseline. States he is ready to go home but is okay with SNF for a few days.   Objective:  Vital signs in last 24 hours: Vitals:   02/22/22 1930 02/23/22 0027 02/23/22 0436 02/23/22 0907  BP: 115/60 109/67 106/61   Pulse:  86 87   Resp: '19 18 20   '$ Temp: 97.7 F (36.5 C) 98.1 F (36.7 C) 98.7 F (37.1 C)   TempSrc: Oral Oral Oral   SpO2: 98% 96% 95% 95%  Weight:  67 kg    Height:       Supplemental O2: Nasal Cannula SpO2: 95 % O2 Flow Rate (L/min): 2 L/min   Physical Exam:  Constitutional: Well-developed, well-nourished, and in no distress.  Sitting in chair. HENT: EOMI. Head: Normocephalic and atraumatic.  Cardiovascular: Regular rate, irregular rhythm.  No M/R/G. Pulmonary: Non labored breathing on RA Psych: Normal mood and affect.   Filed Weights   02/21/22 0008 02/22/22 0353 02/23/22 0027  Weight: 67.2 kg 66.9 kg 67 kg     Intake/Output Summary (Last 24 hours) at 02/23/2022 1245 Last data filed at 02/23/2022 0800 Gross per 24 hour  Intake 967 ml  Output 800 ml  Net 167 ml   Net IO Since Admission: -432.31 mL [02/23/22 1245]  Pertinent Labs:     Latest Ref Rng & Units 02/23/2022    2:30 AM 02/22/2022    3:47 AM 02/21/2022   12:44 AM  CBC  WBC 4.0 - 10.5 K/uL 9.2  8.5  9.8   Hemoglobin 13.0 - 17.0 g/dL 9.9  9.6  9.7   Hematocrit 39.0 - 52.0 % 28.3  27.7  28.3   Platelets 150 - 400 K/uL 213  175  148        Latest Ref Rng & Units 02/23/2022    2:30 AM 02/22/2022    3:47 AM 02/21/2022   12:44 AM  CMP  Glucose 70 - 99 mg/dL 122  124  126   BUN 8 - 23 mg/dL '12  10  12   '$ Creatinine 0.61 - 1.24 mg/dL 0.72  0.69  0.81   Sodium 135 - 145 mmol/L 134  138  132   Potassium 3.5 - 5.1 mmol/L 4.0  3.7  3.7   Chloride 98 - 111 mmol/L 102  102  101   CO2 22 - 32 mmol/L '23  26  24   '$ Calcium 8.9 - 10.3 mg/dL 9.1  9.3  8.6   Total Protein 6.5 - 8.1 g/dL   5.8   Total Bilirubin 0.3 - 1.2 mg/dL   1.2   Alkaline Phos 38 - 126 U/L   47   AST 15 - 41  U/L   45   ALT 0 - 44 U/L   26     Imaging: VAS [place] UPPER EXTREMITY VENOUS DUPLEX  Result Date: 02/22/2022 UPPER VENOUS STUDY  Patient Name:  Phillip West  Date of Exam:   02/22/2022 Medical Rec #: [id number]         Accession #:    [id number] Date of Birth: 01-29-1930          Patient Gender: M Patient Age:   88 years Exam Location:  [facility] Procedure:      VAS [place] UPPER EXTREMITY VENOUS DUPLEX Referring Phys: Dorian Pod --------------------------------------------------------------------------------  Indications: order indication states "possible clot." Risk Factors: Patient states he had a fracture in his left forearm 2 months ago. Comparison Study: No prior study Performing Technologist: Sharion Dove RVS  Examination Guidelines: A complete evaluation includes B-mode imaging, spectral Doppler, color Doppler, and power Doppler as needed of all accessible portions of each vessel. Bilateral testing is considered an integral part of a complete examination. Limited examinations for reoccurring indications may be performed as noted.  Right Findings:  +----------+------------+---------+-----------+----------+-------+ RIGHT     CompressiblePhasicitySpontaneousPropertiesSummary +----------+------------+---------+-----------+----------+-------+ Subclavian               Yes       Yes                      +----------+------------+---------+-----------+----------+-------+  Left Findings: +----------+------------+---------+-----------+----------+-------+ LEFT      CompressiblePhasicitySpontaneousPropertiesSummary +----------+------------+---------+-----------+----------+-------+ IJV                      Yes       Yes                      +----------+------------+---------+-----------+----------+-------+ Subclavian               Yes       Yes                      +----------+------------+---------+-----------+----------+-------+ Axillary                 Yes       Yes                      +----------+------------+---------+-----------+----------+-------+ Brachial      Full       Yes       Yes                      +----------+------------+---------+-----------+----------+-------+ Radial        Full                                          +----------+------------+---------+-----------+----------+-------+ Ulnar         Full                                          +----------+------------+---------+-----------+----------+-------+ Cephalic      Full                                          +----------+------------+---------+-----------+----------+-------+ Basilic  Full                                          +----------+------------+---------+-----------+----------+-------+  Summary:  Right: No evidence of thrombosis in the subclavian.  Left: No evidence of deep vein thrombosis in the upper extremity. No evidence of superficial vein thrombosis in the upper extremity.  *See table(s) above for measurements and observations.  Diagnosing physician: Monica Martinez MD Electronically signed by  Monica Martinez MD on 02/22/2022 at 5:36:10 PM.    Final     Assessment/Plan:   Principal Problem:   Hip fracture [facility]) Active Problems:   Atrial fibrillation, chronic ([place])   Orthostatic hypotension   Dizziness   Recurrent falls   Patient Summary: Phillip West is a 88 yo M with a PMH of HTN, HLD, chronic atrial fibrillation, thoracic/abdominal aortic aneurysm (ascending aorta 5.9cm) c/b moderate aortic valve insufficiency, tobacco use disorder, COPD, h/o L femur fracture 2021, and T2DM who presented after a fall in the setting of vertiginous symptoms found to have R comminuted intertrochanteric proximal femur fracture s/p operative fixation.  #R comminuted intertrochanteric proximal femur fracture  Patient had a fall after becoming dizzy.  He sustained a right femur fracture. Now s/p open treatment right intertrochanteric fracture with cephalomedullary nail.  Pain well controlled - not requiring any prns. TOC consulted for SNF placement. -Vitamin D levels normal, calcium within normal limits -Consider DEXA scan in the outpatient setting -Pain control with Tylenol 650 mg q6 hr prn, Norco 5-325 1-2 tab q4 hr prn, dilaudid 0.5 q4 hr IV prn -PT/OT  #Fall #Vertiginous symptoms  Reports vertigo symptoms even at rest with previous hints exam equivocal.  Etiology possibly central vertigo, BPPV, cardiogenic, or orthostatic hypotension.  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  This could contribute to imbalance but patient's history of "blacking out" seems more consistent with cardiogenic etiology. -Vestibular PT to evaluate for peripheral vertigo -Repeat orthostatics once able to stand more easily   #Permanent atrial fibrillation with RVR Patient previously on digoxin as well as metoprolol tartrate for permanent A-fib.  Anticoagulation is precluded by his falls. CHADSvasc score 3. HAS Bled score 2.  Review of telemetry showed brief episodes of A-fib with slow ventricular  response. Rate relatively well controlled since adding back amio at lower dose yesterday.  Hemodynamically stable otherwise.   -Cardiology following, appreciate recommendations -Continue digoxin to 0.125 mg half tablet daily -Held metoprolol at 12.5 mg twice daily -Add diltiazem 30 mg BID -Resume PO amiodarone at 200 mg daily. Reduce to 100 mg daily after 1-2 months. -Supplement magnesium, goal K+ >4 -Patient scheduling outpatient f/u with cardiology  #Painless Hematuria Patient has history of renal cysts and gross hematuria. Followed outpatient for this at [facility]. Weak stream but no pain. UA with small Hgb and 6-10 RBCs/HPF. Hgb stable. -patient scheduled outpatient follow up with urology. If unable to make appointment, will advise PCP to place referral -reaching out to pharmacy regarding anticoagulation in setting of hematuria  #Chronic hypotension  Normotensive today.  Continue with blood pressure support in setting of rate controlling medications. -Midodrine 5 mg 3 times daily   #COPD Breo Ellipta daily Duonebs prn   #T/AAA Evaluated by thoracic surgery recently no plans for intervention currently.  HLD Pravastatin 40 mg daily  Diet: Normal IVF: None,None VTE: Xarelto 10 mg daily Code: Full PT/OT recs: SNF TOC recs:  Resources not needed at this time.  Dispo: Anticipated discharge to SNF pending resolution of A-fib with RVR and further medical evaluation  Linward Natal, MD PGY1 Internal Medicine [phone]

## 2022-02-23 NOTE — Consult Note (Signed)
Ref: Windy Fast, MD   Subjective:  Awake. No chest pain. HR in 90's to 110/min. Back on amiodarone. Atrial fibrillation continues. Last echocardiogram in 10/2021 showed near normal LV systolic function with dilated LA.  Objective:  Vital Signs in the last 24 hours: Temp:  [97.6 F (36.4 C)-98.7 F (37.1 C)] 98.7 F (37.1 C) (10/10 0436) Pulse Rate:  [83-98] 87 (10/10 0436) Cardiac Rhythm: Atrial fibrillation (10/10 0800) Resp:  [18-20] 20 (10/10 0436) BP: (106-116)/(60-73) 106/61 (10/10 0436) SpO2:  [93 %-98 %] 95 % (10/10 0907) Weight:  [67 kg] 67 kg (10/10 0027)  Physical Exam: BP Readings from Last 1 Encounters:  02/23/22 106/61     Wt Readings from Last 1 Encounters:  02/23/22 67 kg    Weight change: 0.1 kg Body mass index is 22.46 kg/m. HEENT: /AT, Eyes-Blue, wears glasses, Conjunctiva-Pale pink, Sclera-Non-icteric Neck: No JVD, No bruit, Trachea midline. Lungs:  Clear, Bilateral. Cardiac:  Irregular rhythm, normal S1 and S2, no S3. II/VI systolic murmur. Abdomen:  Soft, non-tender. BS present. Extremities:  No edema present. No cyanosis. No clubbing. CNS: AxOx3, Cranial nerves grossly intact, moves all 4 extremities.  Skin: Warm and dry.   Intake/Output from previous day: 10/09 0701 - 10/10 0700 In: 847 [P.O.:847] Out: 800 [Urine:800]    Lab Results: BMET    Component Value Date/Time   NA 134 (L) 02/23/2022 0230   NA 138 02/22/2022 0347   NA 132 (L) 02/21/2022 0044   K 4.0 02/23/2022 0230   K 3.7 02/22/2022 0347   K 3.7 02/21/2022 0044   CL 102 02/23/2022 0230   CL 102 02/22/2022 0347   CL 101 02/21/2022 0044   CO2 23 02/23/2022 0230   CO2 26 02/22/2022 0347   CO2 24 02/21/2022 0044   GLUCOSE 122 (H) 02/23/2022 0230   GLUCOSE 124 (H) 02/22/2022 0347   GLUCOSE 126 (H) 02/21/2022 0044   BUN 12 02/23/2022 0230   BUN 10 02/22/2022 0347   BUN 12 02/21/2022 0044   CREATININE 0.72 02/23/2022 0230   CREATININE 0.69 02/22/2022 0347   CREATININE  0.81 02/21/2022 0044   CREATININE 0.87 06/15/2019 0855   CALCIUM 9.1 02/23/2022 0230   CALCIUM 9.3 02/22/2022 0347   CALCIUM 8.6 (L) 02/21/2022 0044   GFRNONAA >60 02/23/2022 0230   GFRNONAA >60 02/22/2022 0347   GFRNONAA >60 02/21/2022 0044   CBC    Component Value Date/Time   WBC 9.2 02/23/2022 0230   RBC 2.98 (L) 02/23/2022 0230   HGB 9.9 (L) 02/23/2022 0230   HCT 28.3 (L) 02/23/2022 0230   PLT 213 02/23/2022 0230   MCV 95.0 02/23/2022 0230   MCH 33.2 02/23/2022 0230   MCHC 35.0 02/23/2022 0230   RDW 14.0 02/23/2022 0230   LYMPHSABS 1.3 02/16/2022 1215   MONOABS 0.7 02/16/2022 1215   EOSABS 0.2 02/16/2022 1215   BASOSABS 0.1 02/16/2022 1215   HEPATIC Function Panel Recent Labs    02/18/22 0220 02/20/22 0606 02/21/22 0044  PROT 5.7* 5.5* 5.8*  ALBUMIN 3.3* 2.9* 2.8*  AST 28 26 45*  ALT '12 13 26  '$ ALKPHOS 38 46 47   HEMOGLOBIN A1C Lab Results  Component Value Date   MPG 136.98 04/25/2020   CARDIAC ENZYMES No results found for: "CKTOTAL", "CKMB", "CKMBINDEX", "TROPONINI" BNP No results for input(s): "PROBNP" in the last 8760 hours. TSH Recent Labs    02/19/22 0742  TSH 1.715   CHOLESTEROL No results for input(s): "CHOL" in the last 8760 hours.  Scheduled Meds:  amiodarone  200 mg Oral Daily   digoxin  0.0625 mg Oral Daily   docusate sodium  100 mg Oral BID   fluticasone furoate-vilanterol  1 puff Inhalation Daily   midodrine  5 mg Oral TID WC   pravastatin  40 mg Oral QHS   Continuous Infusions: PRN Meds:.acetaminophen, HYDROcodone-acetaminophen, HYDROcodone-acetaminophen, ipratropium-albuterol, menthol-cetylpyridinium **OR** phenol, morphine injection, ondansetron **OR** ondansetron (ZOFRAN) IV  Assessment/Plan: Atrial fibrillation with moderate ventricular response HTN COPD AA aneurysm Type 2 DM S/P right hip fracture with surgery  Plan: Add very small dose of diltiazem like 30 mg. Bid.. Increase activity as tolerated.   LOS: 7 days    Time spent including chart review, lab review, examination, discussion with patient/Nurse : 30 min   Dixie Dials  MD  02/23/2022, 12:56 PM

## 2022-02-23 NOTE — TOC Progression Note (Signed)
Transition of Care [facility]) - Progression Note    Patient Details  Name: Phillip West MRN: [id number] Date of Birth: 25-Jul-1929  Transition of Care [facility]) CM/SW [place], [place] Phone Number: 02/23/2022, 1:04 PM  Clinical Narrative:     Met with pt and provided SNF bed offers. Pt's first choice is [place] and 2nd choice would be [facility]. CSW contacted [facility] and inquired if they have bed available tomorrow; awaiting response.  Expected Discharge Plan: [place] Barriers to Discharge: Continued Medical Work up  Expected Discharge Plan and Services Expected Discharge Plan: [place] arrangements for the past 2 months: Single Family Home                                       Social Determinants of Health (SDOH) Interventions Housing Interventions: Intervention Not Indicated  Readmission Risk Interventions    04/28/2020   12:12 PM  Readmission Risk Prevention Plan  Post Dischage Appt Complete  Medication Screening Complete  Transportation Screening Complete

## 2022-02-23 NOTE — Progress Notes (Signed)
Physical Therapy Treatment Patient Details Name: Phillip West MRN: [id number] DOB: Aug 07, 1929 Today's Date: 02/23/2022   History of Present Illness 88 y/o male presented to ED on 02/16/22 after fall at mailbox. Sustained R peritrochanteric fx. S/p R femur IM nail on 10/3. PMH includes abdominal aortic aneurysms, COPD, chronic Afib, T2DM, h/o L femur fx 2021    PT Comments    Patient up in chair on arrival and reports too fatigued to ambulate more than chair to bed. Patient progressing as only needed min assist to stand from recliner and step-pivot to bed with RW. Discussed patient's experience with dizziness--he had none today. He reports it "happens whenever it wants to!" He had seconds of a spinning sensation just before falling at the mailbox (denies any head movements, looking up, or turning of body when it happened). He reports another episode when putting in his eyedrops. Assessed bil Dix-Hallpike for BPPV and elicited no symptoms of vertigo. Unclear cause of his brief vertigo symptoms at this time. Are not consistent with central cause or a neuritis. Will continue to monitor.    Recommendations for follow up therapy are one component of a multi-disciplinary discharge planning process, led by the attending physician.  Recommendations may be updated based on patient status, additional functional criteria and insurance authorization.  Follow Up Recommendations  Skilled nursing-short term rehab (<3 hours/day) Can patient physically be transported by private vehicle: No   Assistance Recommended at Discharge Frequent or constant Supervision/Assistance  Patient can return home with the following A lot of help with walking and/or transfers;A little help with bathing/dressing/bathroom;Assistance with cooking/housework;Direct supervision/assist for medications management;Direct supervision/assist for financial management;Assist for transportation;Help with stairs or ramp for entrance   Equipment  Recommendations  Rolling walker (2 wheels);BSC/3in1    Recommendations for Other Services       Precautions / Restrictions Precautions Precautions: Fall Precaution Comments: dizziness, L wrist fx (12/2021) - patient states able to bear weight through wrist Required Braces or Orthoses: Other Brace Other Brace: left wrist brace Restrictions Weight Bearing Restrictions: No Other Position/Activity Restrictions: pt is reporting he is permitted to WB on LUE wrist     Mobility  Bed Mobility Overal bed mobility: Needs Assistance Bed Mobility: Sit to Supine       Sit to supine: Min assist   General bed mobility comments: assist with RLE up onto bed, pt manages upper body    Transfers Overall transfer level: Needs assistance Equipment used: Rolling walker (2 wheels) Transfers: Sit to/from Stand, Bed to chair/wheelchair/BSC Sit to Stand: Min assist   Step pivot transfers: Min assist       General transfer comment: min boosting assist; pt refused ambulation and would only step-pivot to bed from chair    Ambulation/Gait               General Gait Details: steps for transfers to bed only   Stairs             Wheelchair Mobility    Modified Rankin (Stroke Patients Only)       Balance Overall balance assessment: Needs assistance Sitting-balance support: Feet supported Sitting balance-Leahy Scale: Fair Sitting balance - Comments: able to sit on EOB without UE support   Standing balance support: Bilateral upper extremity supported, During functional activity Standing balance-Leahy Scale: Poor Standing balance comment: reliant on UE support for balance  Cognition Arousal/Alertness: Awake/alert Behavior During Therapy: WFL for tasks assessed/performed Overall Cognitive Status: Within Functional Limits for tasks assessed                                          Exercises      General Comments  General comments (skin integrity, edema, etc.): Pt reports brief (seconds) periods of spinning sensation that is wildly variable "just hits when it wants to!" Reports he had an episode when at the mailbox, but denies that he was turning or moving his head in anyway when it occured.Reports it had happened when looking up to do his eye drops, therefore assessed bil Dix-Hallpike with no symptoms provoked. Assessed VORx1 and pt able to keep eyes on target with no vertigo or dizziness.       Pertinent Vitals/Pain Pain Assessment Pain Assessment: Faces Faces Pain Scale: Hurts little more Pain Location: R hip Pain Descriptors / Indicators: Guarding, Operative site guarding Pain Intervention(s): Limited activity within patient's tolerance, Monitored during session, Repositioned    Home Living                          Prior Function            PT Goals (current goals can now be found in the care plan section) Acute Rehab PT Goals Patient Stated Goal: to be able to walk alone Time For Goal Achievement: 03/03/22 Potential to Achieve Goals: Good Progress towards PT goals: Progressing toward goals    Frequency    Min 4X/week      PT Plan Current plan remains appropriate    Co-evaluation              AM-PAC PT "6 Clicks" Mobility   Outcome Measure  Help needed turning from your back to your side while in a flat bed without using bedrails?: A Lot Help needed moving from lying on your back to sitting on the side of a flat bed without using bedrails?: A Lot Help needed moving to and from a bed to a chair (including a wheelchair)?: A Little Help needed standing up from a chair using your arms (e.g., wheelchair or bedside chair)?: A Little Help needed to walk in hospital room?: Total Help needed climbing 3-5 steps with a railing? : Total 6 Click Score: 12    End of Session Equipment Utilized During Treatment: Gait belt Activity Tolerance: Patient limited by  fatigue Patient left: with call bell/phone within reach;in bed;with bed alarm set Nurse Communication: Mobility status PT Visit Diagnosis: Muscle weakness (generalized) (M62.81);Unsteadiness on feet (R26.81);Difficulty in walking, not elsewhere classified (R26.2);History of falling (Z91.81)     Time: 0177-9390 PT Time Calculation (min) (ACUTE ONLY): 24 min  Charges:  $Therapeutic Activity: 23-37 mins                      Arby Barrette, PT Acute Rehabilitation Services  Office [phone]    Rexanne Mano 02/23/2022, 2:07 PM

## 2022-02-24 LAB — CBC
HCT: 29.4 % — ABNORMAL LOW (ref 39.0–52.0)
Hemoglobin: 9.8 g/dL — ABNORMAL LOW (ref 13.0–17.0)
MCH: 32.6 pg (ref 26.0–34.0)
MCHC: 33.3 g/dL (ref 30.0–36.0)
MCV: 97.7 fL (ref 80.0–100.0)
Platelets: 279 10*3/uL (ref 150–400)
RBC: 3.01 MIL/uL — ABNORMAL LOW (ref 4.22–5.81)
RDW: 13.9 % (ref 11.5–15.5)
WBC: 8.3 10*3/uL (ref 4.0–10.5)
nRBC: 0 % (ref 0.0–0.2)

## 2022-02-24 LAB — BASIC METABOLIC PANEL
Anion gap: 8 (ref 5–15)
BUN: 11 mg/dL (ref 8–23)
CO2: 23 mmol/L (ref 22–32)
Calcium: 9.3 mg/dL (ref 8.9–10.3)
Chloride: 103 mmol/L (ref 98–111)
Creatinine, Ser: 0.67 mg/dL (ref 0.61–1.24)
GFR, Estimated: >60 mL/min (ref >60)
Glucose, Bld: 118 mg/dL — ABNORMAL HIGH (ref 70–99)
Potassium: 3.7 mmol/L (ref 3.5–5.1)
Sodium: 134 mmol/L — ABNORMAL LOW (ref 135–145)

## 2022-02-24 LAB — TESTOSTERONE,FREE AND TOTAL
Testosterone, Free: 1 pg/mL — ABNORMAL LOW (ref 6.6–18.1)
Testosterone: 128 ng/dL — ABNORMAL LOW (ref 264–916)

## 2022-02-24 MED ORDER — SENNA 8.6 MG PO TABS: 1.0000 | ORAL_TABLET | Freq: Every day | ORAL | 0 refills | Status: AC

## 2022-02-24 MED ORDER — HYDROCODONE-ACETAMINOPHEN 5-325 MG PO TABS: 1.0000 | ORAL_TABLET | Freq: Four times a day (QID) | ORAL | 0 refills | Status: AC | PRN

## 2022-02-24 MED ORDER — ACETAMINOPHEN 325 MG PO TABS: 650.0000 mg | ORAL_TABLET | Freq: Four times a day (QID) | ORAL | Status: AC

## 2022-02-24 MED ORDER — POTASSIUM CHLORIDE 20 MEQ PO PACK
40.0000 meq | PACK | Freq: Two times a day (BID) | ORAL | Status: DC
  Administered 2022-02-24: 40 meq via ORAL
  Filled 2022-02-24: qty 2

## 2022-02-24 MED ORDER — TESTOSTERONE CYPIONATE 100 MG/ML IM SOLN: 100.0000 mg | INTRAMUSCULAR | 0 refills | Status: DC

## 2022-02-24 MED ORDER — CYANOCOBALAMIN 500 MCG PO TABS: 500.0000 ug | ORAL_TABLET | Freq: Every day | ORAL | 0 refills | Status: AC

## 2022-02-24 MED ORDER — DILTIAZEM HCL 30 MG PO TABS: 30.0000 mg | ORAL_TABLET | Freq: Two times a day (BID) | ORAL | Status: AC

## 2022-02-24 MED ORDER — ENOXAPARIN SODIUM 40 MG/0.4ML IJ SOSY: 40.0000 mg | PREFILLED_SYRINGE | Freq: Every day | INTRAMUSCULAR | Status: AC

## 2022-02-24 MED ORDER — ACETAMINOPHEN 325 MG PO TABS: 325.0000 mg | ORAL_TABLET | Freq: Four times a day (QID) | ORAL | Status: DC | PRN

## 2022-02-24 MED ORDER — AMIODARONE HCL 200 MG PO TABS: 200.0000 mg | ORAL_TABLET | Freq: Every day | ORAL | Status: AC

## 2022-02-24 MED ORDER — DIGOXIN 62.5 MCG PO TABS: 0.0625 mg | ORAL_TABLET | Freq: Every day | ORAL | Status: AC

## 2022-02-24 MED ORDER — ACETAMINOPHEN 325 MG PO TABS: 325.0000 mg | ORAL_TABLET | Freq: Four times a day (QID) | ORAL | Status: DC

## 2022-02-24 NOTE — Progress Notes (Signed)
Physical Therapy Treatment Patient Details Name: Phillip West MRN: [id number] DOB: February 02, 1930 Today's Date: 02/24/2022   History of Present Illness 88 y/o male presented to ED on 02/16/22 after fall at mailbox. Sustained R peritrochanteric fx. S/p R femur IM nail on 10/3. PMH includes abdominal aortic aneurysms, COPD, chronic Afib, T2DM, h/o L femur fx 2021    PT Comments    Pt progressing with mobility. Today's session focused on LE strengthening and transfers in order to progress towards ambulation. Pt performed step pivot transfer, lateral stepping, and sit to stands with RW, progressing to trial of ambulation 6 ft. Pt denied dizziness during today's session, and is highly inquisitive about purpose of movement so close to recent R hip operation. Pt remains limited by generalized weakness, decreased activity tolerance, and impaired balance strategies/postural reactions. Continue to recommend SNF to maximize functional mobility and independence prior to return home.    Recommendations for follow up therapy are one component of a multi-disciplinary discharge planning process, led by the attending physician.  Recommendations may be updated based on patient status, additional functional criteria and insurance authorization.  Follow Up Recommendations  Skilled nursing-short term rehab (<3 hours/day)     Assistance Recommended at Discharge Frequent or constant Supervision/Assistance  Patient can return home with the following A lot of help with walking and/or transfers;A little help with bathing/dressing/bathroom;Assistance with cooking/housework;Direct supervision/assist for medications management;Direct supervision/assist for financial management;Assist for transportation;Help with stairs or ramp for entrance   Equipment Recommendations  Rolling walker (2 wheels);BSC/3in1    Recommendations for Other Services       Precautions / Restrictions Precautions Precautions: Fall Precaution  Comments: dizziness, L wrist fx (12/2021) Required Braces or Orthoses: Other Brace Other Brace: left wrist brace Restrictions Weight Bearing Restrictions: Yes RLE Weight Bearing: Weight bearing as tolerated     Mobility  Bed Mobility Overal bed mobility: Needs Assistance Bed Mobility: Sit to Supine       Sit to supine: Min assist   General bed mobility comments: Assist with LEs onto bed, able to scoot up in bed with UEs and handrails    Transfers Overall transfer level: Needs assistance Equipment used: Rolling walker (2 wheels) Transfers: Sit to/from Stand, Bed to chair/wheelchair/BSC Sit to Stand: Min assist   Step pivot transfers: Min assist, Min guard       General transfer comment: Pt able to perform step pivot and several small lateral steps with RW min guard for steadying and RW negotiation with line management    Ambulation/Gait Ambulation/Gait assistance: Min guard Gait Distance (Feet): 6 Feet Assistive device: Rolling walker (2 wheels) Gait Pattern/deviations: Step-to pattern, Decreased stride length       General Gait Details: steps for transfers to bed, side stepping along side of bed, backward stepping to surface, short trial of gait prior to end of session   Stairs             Wheelchair Mobility    Modified Rankin (Stroke Patients Only)       Balance Overall balance assessment: Needs assistance Sitting-balance support: Feet supported Sitting balance-Leahy Scale: Fair Sitting balance - Comments: sit EOB and edge of chair feet supported with no LOB   Standing balance support: Bilateral upper extremity supported, During functional activity Standing balance-Leahy Scale: Poor Standing balance comment: reliant on UE support for balance, able to remove 1 hand from RW intermittently for forward reach and removing of obstacles in path of transfer  Cognition Arousal/Alertness: Awake/alert Behavior During  Therapy: WFL for tasks assessed/performed Overall Cognitive Status: Within Functional Limits for tasks assessed                                 General Comments: Pt able to verbalize WB precautions        Exercises General Exercises - Lower Extremity Ankle Circles/Pumps: AROM, Both, 10 reps, Seated Straight Leg Raises: AAROM, Both, 10 reps (therapist assistance due to pt request) Other Exercises Other Exercises: 6 sit to stands, min assist progressing to min guard with intermittent rest breaks due to fatigue    General Comments General comments (skin integrity, edema, etc.): Pt reported no dizziness today. Pt is firm in directions of how to assist R LE during transfers      Pertinent Vitals/Pain Pain Assessment Pain Assessment: Faces Faces Pain Scale: Hurts little more Pain Location: R hip Pain Descriptors / Indicators: Aching, Guarding, Tender Pain Intervention(s): Monitored during session, Limited activity within patient's tolerance    Home Living                          Prior Function            PT Goals (current goals can now be found in the care plan section) Acute Rehab PT Goals Patient Stated Goal: to be able to walk alone PT Goal Formulation: With patient Time For Goal Achievement: 03/03/22 Potential to Achieve Goals: Fair Progress towards PT goals: Progressing toward goals    Frequency    Min 4X/week      PT Plan Current plan remains appropriate    Co-evaluation              AM-PAC PT "6 Clicks" Mobility   Outcome Measure  Help needed turning from your back to your side while in a flat bed without using bedrails?: A Little Help needed moving from lying on your back to sitting on the side of a flat bed without using bedrails?: A Lot Help needed moving to and from a bed to a chair (including a wheelchair)?: A Little Help needed standing up from a chair using your arms (e.g., wheelchair or bedside chair)?: A Little Help  needed to walk in hospital room?: A Lot Help needed climbing 3-5 steps with a railing? : Total 6 Click Score: 14    End of Session Equipment Utilized During Treatment: Gait belt (Lt wrist brace) Activity Tolerance: Patient tolerated treatment well Patient left: with call bell/phone within reach;in bed;with family/visitor present Nurse Communication: Mobility status PT Visit Diagnosis: Muscle weakness (generalized) (M62.81);Unsteadiness on feet (R26.81);Difficulty in walking, not elsewhere classified (R26.2);History of falling (Z91.81)     Time: 2297-9892 PT Time Calculation (min) (ACUTE ONLY): 31 min  Charges:  $Therapeutic Activity: 23-37 mins                     Chipper Oman, SPT    [place] Lacosta Hargan 02/24/2022, 3:49 PM

## 2022-02-24 NOTE — Consult Note (Signed)
Ref: Windy Fast, MD   Subjective:  Tolerating low dose diltiazem. Atrial fibrillation continues. HR in 80's to 100's..  Objective:  Vital Signs in the last 24 hours: Temp:  [97.8 F (36.6 C)-97.9 F (36.6 C)] 97.8 F (36.6 C) (10/11 0912) Pulse Rate:  [71-111] 88 (10/11 0912) Cardiac Rhythm: Atrial fibrillation (10/11 0800) Resp:  [16-24] 16 (10/11 0912) BP: (102-131)/(54-80) 105/61 (10/11 0912) SpO2:  [92 %-96 %] 96 % (10/11 0912) Weight:  [63.8 kg] 63.8 kg (10/11 0451)  Physical Exam: BP Readings from Last 1 Encounters:  02/24/22 105/61     Wt Readings from Last 1 Encounters:  02/24/22 63.8 kg    Weight change: -3.2 kg Body mass index is 21.39 kg/m. HEENT: [place]/AT, Eyes-Blue, Conjunctiva-Pale pink, Sclera-Non-icteric Neck: No JVD, No bruit, Trachea midline. Lungs:  Clear, Bilateral. Cardiac:  Irregular rhythm, normal S1 and S2, no S3. II/VI systolic murmur. Abdomen:  Soft, non-tender. BS present. Extremities:  No edema present. No cyanosis. No clubbing. CNS: AxOx3, Cranial nerves grossly intact, moves all 4 extremities. Slow gait with walker Skin: Warm and dry.   Intake/Output from previous day: 10/10 0701 - 10/11 0700 In: 840 [P.O.:840] Out: 1100 [Urine:1100]    Lab Results: BMET    Component Value Date/Time   NA 134 (L) 02/24/2022 0602   NA 134 (L) 02/23/2022 0230   NA 138 02/22/2022 0347   K 3.7 02/24/2022 0602   K 4.0 02/23/2022 0230   K 3.7 02/22/2022 0347   CL 103 02/24/2022 0602   CL 102 02/23/2022 0230   CL 102 02/22/2022 0347   CO2 23 02/24/2022 0602   CO2 23 02/23/2022 0230   CO2 26 02/22/2022 0347   GLUCOSE 118 (H) 02/24/2022 0602   GLUCOSE 122 (H) 02/23/2022 0230   GLUCOSE 124 (H) 02/22/2022 0347   BUN 11 02/24/2022 0602   BUN 12 02/23/2022 0230   BUN 10 02/22/2022 0347   CREATININE 0.67 02/24/2022 0602   CREATININE 0.72 02/23/2022 0230   CREATININE 0.69 02/22/2022 0347   CREATININE 0.87 06/15/2019 0855   CALCIUM 9.3 02/24/2022  0602   CALCIUM 9.1 02/23/2022 0230   CALCIUM 9.3 02/22/2022 0347   GFRNONAA >60 02/24/2022 0602   GFRNONAA >60 02/23/2022 0230   GFRNONAA >60 02/22/2022 0347   CBC    Component Value Date/Time   WBC 8.3 02/24/2022 0602   RBC 3.01 (L) 02/24/2022 0602   HGB 9.8 (L) 02/24/2022 0602   HCT 29.4 (L) 02/24/2022 0602   PLT 279 02/24/2022 0602   MCV 97.7 02/24/2022 0602   MCH 32.6 02/24/2022 0602   MCHC 33.3 02/24/2022 0602   RDW 13.9 02/24/2022 0602   LYMPHSABS 1.3 02/16/2022 1215   MONOABS 0.7 02/16/2022 1215   EOSABS 0.2 02/16/2022 1215   BASOSABS 0.1 02/16/2022 1215   HEPATIC Function Panel Recent Labs    02/18/22 0220 02/20/22 0606 02/21/22 0044  PROT 5.7* 5.5* 5.8*  ALBUMIN 3.3* 2.9* 2.8*  AST 28 26 45*  ALT '12 13 26  '$ ALKPHOS 38 46 47   HEMOGLOBIN A1C Lab Results  Component Value Date   MPG 136.98 04/25/2020   CARDIAC ENZYMES No results found for: "CKTOTAL", "CKMB", "CKMBINDEX", "TROPONINI" BNP No results for input(s): "PROBNP" in the last 8760 hours. TSH Recent Labs    02/19/22 0742  TSH 1.715   CHOLESTEROL No results for input(s): "CHOL" in the last 8760 hours.  Scheduled Meds:  amiodarone  200 mg Oral Daily   digoxin  0.0625 mg  Oral Daily   diltiazem  30 mg Oral BID   enoxaparin (LOVENOX) injection  40 mg Subcutaneous Daily   fluticasone furoate-vilanterol  1 puff Inhalation Daily   midodrine  5 mg Oral TID WC   potassium chloride  40 mEq Oral BID   pravastatin  40 mg Oral QHS   Continuous Infusions: PRN Meds:.acetaminophen, HYDROcodone-acetaminophen, HYDROcodone-acetaminophen, ipratropium-albuterol, menthol-cetylpyridinium **OR** phenol, morphine injection, ondansetron **OR** ondansetron (ZOFRAN) IV  Assessment/Plan: Atrial fibrillation with controlled ventricular response. HTN COPD AA aneurysm S/P right hip fracture with surgery Type 2 DM  Plan: Continue medications. F/U in 1-2 months in office.   LOS: 8 days   Time spent including  chart review, lab review, examination, discussion with patient/Nurse/Tech : 30 min   Dixie Dials  MD  02/24/2022, 9:30 AM

## 2022-02-24 NOTE — TOC Transition Note (Signed)
Transition of Care [facility]) - CM/SW Discharge Note   Patient Details  Name: Phillip West MRN: [id number] Date of Birth: 01-13-1930  [facility] CM/SW Contact:  Bethann Berkshire, [place] Phone Number: 02/24/2022, 1:27 PM   Clinical Narrative:     Patient will DC to: [facility] SNF Anticipated DC date: 02/24/22 Family notified: Pt states he will notify his son Transport by: Corey Harold   Per MD patient ready for DC to [facility]. RN, patient, and facility notified of DC. Discharge Summary and FL2 sent to facility. RN to call report prior to discharge ( [phone] Room 124). DC packet on chart. Ambulance transport requested for patient.   CSW will sign off for now as social work intervention is no longer needed. Please consult [place] again if new needs arise.   Final next level of care: Skilled Nursing Facility Barriers to Discharge: No Barriers Identified   Patient Goals and CMS Choice        Discharge Placement              Patient chooses bed at: [facility] Patient to be transferred to facility by: [facility] Name of family member notified: Pt says he will notify son Patient and family notified of of transfer: 02/24/22  Discharge Plan and Services                                     Social Determinants of Health (SDOH) Interventions Housing Interventions: Intervention Not Indicated   Readmission Risk Interventions    04/28/2020   12:12 PM  Readmission Risk Prevention Plan  Post Dischage Appt Complete  Medication Screening Complete  Transportation Screening Complete

## 2022-02-24 NOTE — Progress Notes (Signed)
Report has been called to [place], AVS printed for PTAR.   Alvis Lemmings, RN 02/24/2022 1:43 PM

## 2022-02-24 NOTE — Discharge Summary (Addendum)
Name: Phillip West MRN: [id number] DOB: 1929-06-13 88 y.o. PCP: Windy Fast, MD  Date of Admission: 02/16/2022 10:56 AM Date of Discharge: 02/24/2022 Attending Physician: Angelica Pou, MD  Discharge Diagnosis: 1. Principal Problem:   Hip fracture ([place]) Active Problems:   Atrial fibrillation, chronic (HCC)   Dizziness   Recurrent falls   Osteoporosis (clinical dx; multiple fall-related fractures)    Discharge Medications: Allergies as of 02/24/2022       Reactions   Quinolones Other (See Comments)   Patient was warned about not using Cipro and similar antibiotics. Recent studies have raised concern that fluoroquinolone antibiotics could be associated with an increased risk of aortic aneurysm Fluoroquinolones have non-antimicrobial properties that might jeopardise the integrity of the extracellular matrix of the vascular wall In a  propensity score matched cohort study in [place], there was a 66% increased rate of aortic aneurysm or dissection associated with oral fluoroquinolone use, compared wit   Ciprofloxacin Other (See Comments)   IRREGULAR HEART RATE        Medication List     STOP taking these medications    metoprolol tartrate 25 MG tablet Commonly known as: LOPRESSOR       TAKE these medications    acetaminophen 325 MG tablet Commonly known as: TYLENOL Take 1-2 tablets (325-650 mg total) by mouth every 6 (six) hours as needed for mild pain (pain score 1-3 or temp > 100.5).   albuterol 108 (90 Base) MCG/ACT inhaler Commonly known as: VENTOLIN HFA Inhale 1 puff into the lungs every 6 (six) hours as needed for wheezing or shortness of breath.   amiodarone 200 MG tablet Commonly known as: PACERONE Take 1 tablet (200 mg total) by mouth daily. Start taking on: February 25, 2022   Digoxin 62.5 MCG Tabs Take 0.0625 mg by mouth daily. Start taking on: February 25, 2022 What changed:  medication strength how much to take   diltiazem 30 MG  tablet Commonly known as: CARDIZEM Take 1 tablet (30 mg total) by mouth 2 (two) times daily.   enoxaparin 40 MG/0.4ML injection Commonly known as: LOVENOX Inject 0.4 mLs (40 mg total) into the skin daily. Start taking on: February 25, 2022   HYDROcodone-acetaminophen 5-325 MG tablet Commonly known as: NORCO/VICODIN Take 1 tablet by mouth every 6 (six) hours as needed for up to 7 days for moderate pain or severe pain.   Lubricating Plus Eye Drops 0.5 % Soln Generic drug: Carboxymethylcellulose Sod PF Place 1 drop into both eyes daily.   midodrine 5 MG tablet Commonly known as: PROAMATINE Take 1 tablet (5 mg total) by mouth 3 (three) times daily with meals.   pravastatin 40 MG tablet Commonly known as: PRAVACHOL Take 1 tablet by mouth daily.   Symbicort 160-4.5 MCG/ACT inhaler Generic drug: budesonide-formoterol Inhale 2 puffs into the lungs 2 (two) times daily.   tamsulosin 0.4 MG Caps capsule Commonly known as: FLOMAX Take 0.4 mg by mouth daily.   Vitamin D 50 MCG (2000 UT) tablet Take 1 tablet by mouth daily.        Disposition and follow-up:   [name] was discharged from [facility] in Good condition.  At the hospital follow up visit please address:  1.  Dizziness, anemia, falls, low normal vitamin b12, hematuria (ensure remains resolved and that patient has follow up with urology), hypogonadism   2.  Labs / imaging needed at time of follow-up: CBC (ensure CBC is stable), digoxin level  monitoring (toxicity can develop at lower serum levels in older persons)  3.  Pending labs/ test needing follow-up: None  Follow-up Appointments:  Follow-up Information     Georgeanna Harrison, MD. Schedule an appointment as soon as possible for a visit in 2 week(s).   Specialty: Orthopedic Surgery Contact information: [place] [address] [phone]         Windy Fast, MD Follow up.   Specialty: Internal Medicine Why:  Please follow up in a week. Contact information: [place] [address] [phone]         Lorretta Harp, MD .   Specialties: Cardiology, Radiology Contact information: [address] [place] [address] [place] by problem list: Patient Summary: Mr. [name] is a 88 yo M with a PMH of HTN, HLD, permanent atrial fibrillation not on [facility] d/t falls and hematuria, thoracic/abdominal aortic aneurysm (ascending aorta 5.9cm) c/b moderate aortic valve insufficiency, tobacco use disorder, COPD, h/o L femur fracture 2021, and T2DM who presented after a fall in the setting of vertiginous symptoms found to have R comminuted intertrochanteric proximal femur fracture now s/p operative fixation.   #R comminuted intertrochanteric proximal femur fracture found to have hypoogonadism Patient had a fall after becoming dizzy.  He sustained a right femur fracture. Now s/p open treatment right intertrochanteric fracture with cephalomedullary nail.  Pain well controlled at rest, moderate to severe with weight bearing and leg mobility. He will discharge to SNF. -Vitamin D levels normal, calcium within normal limits - he should take a supplement for maintenance. -Consider DEXA scan in the outpatient setting, though he was be a candidate for bisphosphonate regardless.  A testosterone level was checked due to osteoporotic bone noted.      Component Ref Range & Units 6 d ago  Testosterone 264 - 916 ng/dL 128 Low   Comment: (NOTE) Adult male reference interval is based on a population of healthy nonobese males (BMI <30) between 53 and 31 years old. [place], [place] [phone]. PMID: 54627035.  Testosterone, Free 6.6 - 18.1 pg/mL 1.0 Low      Patient will need to be seen by urology in the outpatient setting to decide the dose of testosterone.   #Fall #Vertiginous symptoms  Patient with history of dizziness  that will last hours but also some moments where he "blacks out" without prodrome. Previous hints exam equivocal. Vestibular PT evaluated patient and his Marye Round bilaterally was negative.  He also had negative orthostatic vitals, although it was only in the supine and seated position. Etiology possibly central vertigo as patient was noted to have small cerebellar infarcts, cardiogenic in the setting of his permanent afib and he was noted to have RVR occasionally, or orthostatic hypotension.  The latter two seem to be most plausible given his episodes of "blacking out" occasionally. This will need to be monitored in the outpatient setting. He remains at high risk for falls.     #Permanent atrial fibrillation with RVR Patient previously on digoxin as well as metoprolol tartrate for permanent A-fib.  Anticoagulation is precluded by his falls. CHADSvasc score 3. HAS Bled score 2.  Review of telemetry showed brief episodes of A-fib with slow ventricular response while on metoprolol and this was discontinued. His rates began to increase on amiodarone '200mg'$  daily and digoxin only and cardizem '30mg'$  BID was added the day  prior to discharge. Patient's blood pressures tolerated the addition of cardizem. He will discharge with cardizem, amiodarone, and digoxin (at a reduced dose from his home dose).  He will need follow up with cardiology in 1-2 months.  -Cardiology following, appreciate recommendations -Continue digoxin to 0.125 mg half tablet daily -Continue to hold metoprolol at 12.5 mg twice daily -Continue diltiazem 30 mg BID -Continue PO amiodarone at 200 mg daily. Reduce to 100 mg daily after 1-2 months per cardiology recommendations. -Supplement magnesium, goal K+ >4 while on dig -Patient scheduling outpatient f/u with cardiology   #Painless Hematuria Patient has history of renal cysts and recurrent gross hematuria. Followed outpatient for this at [facility], but has not been seen there in some  time. Weak stream but no pain. UA with small Hgb and 6-10 RBCs/HPF. Hgb stable.His hematuria resolved by the time of discharge. He states that he will schedule follow up with urology.    #Chronic hypotension  Normotensive today on midodrine.  Continue with blood pressure support in setting of rate controlling medications. -Midodrine 5 mg 3 times daily   #COPD Breo Ellipta daily Duonebs prn   #T/AAA Evaluated by thoracic surgery recently no plans for intervention currently.   HLD Pravastatin 40 mg daily  #Anemia Postoperatively. Also noted to have borderline vit b12. Will send patient out on 30d supply of oral vitamin b12. He will need his levels reassessed in the outpatient setting.   Discharge Exam:   BP 105/61 (BP Location: Right Arm)   Pulse 88   Temp 97.8 F (36.6 C) (Oral)   Resp 16   Ht '5\' 8"'$  (1.727 m)   Wt 63.8 kg   SpO2 96%   BMI 21.39 kg/m  Discharge exam:   Pertinent Labs, Studies, and Procedures:      Latest Ref Rng & Units 02/24/2022    6:02 AM 02/23/2022    2:30 AM 02/22/2022    3:47 AM  CBC  WBC 4.0 - 10.5 K/uL 8.3  9.2  8.5   Hemoglobin 13.0 - 17.0 g/dL 9.8  9.9  9.6   Hematocrit 39.0 - 52.0 % 29.4  28.3  27.7   Platelets 150 - 400 K/uL 279  213  175       Latest Ref Rng & Units 02/24/2022    6:02 AM 02/23/2022    2:30 AM 02/22/2022    3:47 AM  BMP  Glucose 70 - 99 mg/dL 118  122  124   BUN 8 - 23 mg/dL '11  12  10   '$ Creatinine 0.61 - 1.24 mg/dL 0.67  0.72  0.69   Sodium 135 - 145 mmol/L 134  134  138   Potassium 3.5 - 5.1 mmol/L 3.7  4.0  3.7   Chloride 98 - 111 mmol/L 103  102  102   CO2 22 - 32 mmol/L '23  23  26   '$ Calcium 8.9 - 10.3 mg/dL 9.3  9.1  9.3        Component Ref Range & Units 4 d ago  Iron 45 - 182 ug/dL 45  TIBC 250 - 450 ug/dL 249 Low   Saturation Ratios 17.9 - 39.5 % 18  UIBC ug/dL 204         Component Ref Range & Units 4 d ago  Ferritin 24 - 336 ng/mL 230         Component Ref Range & Units 4 d ago  Vitamin B-12  180 - 914 pg/mL 389  DG FEMUR, MIN 2 VIEWS RIGHT  Result Date: 02/16/2022 CLINICAL DATA:  Right femoral fracture EXAM: RIGHT FEMUR 2 VIEWS COMPARISON:  Films from earlier in the same day. FLUOROSCOPY TIME:  Radiation Exposure Index (as provided by the fluoroscopic device): 6.51 mGy If the device does not provide the exposure index: Fluoroscopy Time:  57 seconds Number of Acquired Images:  4 FINDINGS: Medullary rod is noted in the proximal right femur. Fixation screws as well as a screw traversing the right femoral neck are noted. Fracture fragments are in near anatomic alignment. No other focal abnormality is noted. IMPRESSION: ORIF of proximal right femoral fracture. Electronically Signed   By: Inez Catalina M.D.   On: 02/16/2022 21:13   DG C-Arm 1-60 Min-No Report  Result Date: 02/16/2022 Fluoroscopy was utilized by the requesting physician.  No radiographic interpretation.   DG Femur Min 2 Views Right  Result Date: 02/16/2022 CLINICAL DATA:  Fall earlier today landing on right side with right hip pain. EXAM: RIGHT FEMUR 2 VIEWS COMPARISON:  None Available. FINDINGS: Diffuse decreased bone mineralization. Evidence of patient's displaced slightly comminuted intertrochanteric fracture of the right proximal femur. Mild degenerative change of the right hip and knee. IMPRESSION: Displaced slightly comminuted intertrochanteric fracture of the right proximal femur. Electronically Signed   By: Marin Olp M.D.   On: 02/16/2022 12:21   DG Pelvis 1-2 Views  Result Date: 02/16/2022 CLINICAL DATA:  Fall today landing on right side with right hip pain. EXAM: PELVIS - 1-2 VIEW COMPARISON:  04/25/2020 FINDINGS: There is a displaced slightly comminuted fracture of the intertrochanteric region of the right femur. Mild superolateral displacement of the predominant distal fragment. Mild bilateral degenerative changes of the hips. Partially visualized hardware over the left femur intact. There is diffuse  decreased bone mineralization present. Mild degenerate change of the spine. IMPRESSION: Displaced slightly comminuted intertrochanteric fracture of the right femur. Electronically Signed   By: Marin Olp M.D.   On: 02/16/2022 12:16      Signed: Rick Duff, MD 02/24/2022, 11:39 AM

## 2022-03-09 NOTE — Progress Notes (Unsigned)
Cardiology Office Note   Date:  03/11/2022   ID:  Phillip West, DOB [birth year], MRN [id number]  PCP:  Windy Fast, MD  Cardiologist:   None   Chief Complaint  Patient presents with   Atrial Fibrillation      History of Present Illness: Phillip West is a 88 y.o. male who presents for evaluation of atrial fib.  He was in the hospital in October with a hip fracture.   He has a history of chronic atrial fib.    He was seen by Dr. Doylene Canard in consultation because of this.  He was previously seen in 2021 because of atrial fib in the ED by Dr. Audie Box.  He was previously seen by Dr. Gwenlyn Found.       I looked through the hospital records.  He had some history of syncope in the past that he describes.  He has been on midodrine.  It does not look like he is been on blood thinners in a long time probably because of fall risk.  The episode that brought him to the hospital was not necessarily syncope.  There definitely is some low blood pressure.  There is vertiginous symptoms.  It was not entirely clear.  He had the atrial fibrillation with rapid rate and he is now wearing a monitor given to him by the [place].  He has not had any syncope.  He is actually in rehab.  He is walking with a walker.  He does get dizzy if he stands up occasionally.  He is not having any palpitations other than his chronic A-fib for years.  He is not having any shortness of breath, PND or orthopnea.  He had no weight gain or edema.  He had an echo in June with NL LV function and moderate MR.     Past Medical History:  Diagnosis Date   Abdominal aortic aneurysm (AAA) (HCC)    Atrial fibrillation ([place])    Chalazion right lower eyelid    COPD (chronic obstructive pulmonary disease) (HCC)    Diabetes mellitus ([place])    Gross hematuria    Hypertension    Syncope and collapse    Thoracic aortic aneurysm (TAA) ([place])     Past Surgical History:  Procedure Laterality Date   HERNIA REPAIR     INTRAMEDULLARY (IM) NAIL  INTERTROCHANTERIC Left 04/26/2020   Procedure: INTRAMEDULLARY (IM) NAIL INTERTROCHANTRIC;  Surgeon: Erle Crocker, MD;  Location: [facility];  Service: Orthopedics;  Laterality: Left;   INTRAMEDULLARY (IM) NAIL INTERTROCHANTERIC Right 02/16/2022   Procedure: INTRAMEDULLARY (IM) NAIL INTERTROCHANTERIC;  Surgeon: Georgeanna Harrison, MD;  Location: [facility];  Service: Orthopedics;  Laterality: Right;   KNEE ARTHROSCOPY       Current Outpatient Medications  Medication Sig Dispense Refill   albuterol (VENTOLIN HFA) 108 (90 Base) MCG/ACT inhaler Inhale 1 puff into the lungs every 6 (six) hours as needed for wheezing or shortness of breath.     Cholecalciferol (VITAMIN D) 50 MCG (2000 UT) tablet Take 1 tablet by mouth daily.     digoxin 62.5 MCG TABS Take 0.0625 mg by mouth daily.     LUBRICATING PLUS EYE DROPS 0.5 % SOLN Place 1 drop into both eyes daily.     midodrine (PROAMATINE) 5 MG tablet Take 1 tablet (5 mg total) by mouth 3 (three) times daily with meals. 90 tablet 0   pravastatin (PRAVACHOL) 40 MG tablet Take 1 tablet by mouth daily.     SYMBICORT  160-4.5 MCG/ACT inhaler Inhale 2 puffs into the lungs 2 (two) times daily.     tamsulosin (FLOMAX) 0.4 MG CAPS capsule Take 0.4 mg by mouth daily.     acetaminophen (TYLENOL) 325 MG tablet Take 2 tablets (650 mg total) by mouth every 6 (six) hours. (Patient not taking: Reported on 03/11/2022)     amiodarone (PACERONE) 200 MG tablet Take 1 tablet (200 mg total) by mouth daily.     cyanocobalamin (VITAMIN B12) 500 MCG tablet Take 1 tablet (500 mcg total) by mouth daily. 30 tablet 0   diltiazem (CARDIZEM) 30 MG tablet Take 1 tablet (30 mg total) by mouth 2 (two) times daily. (Patient not taking: Reported on 03/11/2022)     enoxaparin (LOVENOX) 40 MG/0.4ML injection Inject 0.4 mLs (40 mg total) into the skin daily. 0 mL    senna (SENOKOT) 8.6 MG TABS tablet Take 1 tablet (8.6 mg total) by mouth daily. 120 tablet 0   No current facility-administered  medications for this visit.    Allergies:   Quinolones and Ciprofloxacin    ROS:  Please see the history of present illness.   Otherwise, review of systems are positive for none.   All other systems are reviewed and negative.    PHYSICAL EXAM: VS:  BP (!) 100/58   Pulse 80   Ht '5\' 8"'$  (1.727 m)   Wt 144 lb (65.3 kg)   SpO2 93%   BMI 21.90 kg/m  , BMI Body mass index is 21.9 kg/m. GEN:  No distress NECK:  No jugular venous distention at 90 degrees, waveform within normal limits, carotid upstroke brisk and symmetric, no bruits, no thyromegaly LYMPHATICS:  No cervical adenopathy LUNGS:  Clear to auscultation bilaterally BACK:  No CVA tenderness CHEST:  Unremarkable HEART:  S1 and S2 within normal limits, no S3, no clicks, no rubs, no murmurs, irregular  ABD:  Positive bowel sounds normal in frequency in pitch, no bruits, no rebound, no guarding, unable to assess midline mass or bruit with the patient seated. EXT:  2 plus pulses throughout, trace edema, no cyanosis no clubbing SKIN:  No rashes no nodules NEURO:  Cranial nerves II through XII grossly intact, motor grossly intact throughout PSYCH:  Cognitively intact, oriented to person place and time   EKG:  EKG is ordered today. The ekg ordered today demonstrates atrial fibrillation, rate 81, axis within normal limits, intervals within normal limits, no acute ST-T wave changes   Recent Labs: 02/19/2022: TSH 1.715 02/21/2022: ALT 26 02/22/2022: Magnesium 2.0 02/24/2022: BUN 11; Creatinine, Ser 0.67; Hemoglobin 9.8; Platelets 279; Potassium 3.7; Sodium 134    Lipid Panel No results found for: "CHOL", "TRIG", "HDL", "CHOLHDL", "VLDL", "LDLCALC", "LDLDIRECT"    Wt Readings from Last 3 Encounters:  03/11/22 144 lb (65.3 kg)  02/24/22 140 lb 10.5 oz (63.8 kg)  12/04/21 150 lb (68 kg)      Other studies Reviewed: Additional studies/ records that were reviewed today include: Extensive review of hospital.  records.   (Greater  than 40 minutes reviewing all data with greater than 50% face to face with the patient). Review of the above records demonstrates:  Please see elsewhere in the note.     ASSESSMENT AND PLAN:  Chronic atrial fib: He has chronic atrial fibrillation.  He is high fall risk so he is not on anticoagulation and I do not disagree with this.  He would not want a Watchman.  He has a monitor on that I can follow for  rate control but it apparently appears to be well controlled and I would continue the amiodarone for this.  Ascending aortic aneurysm: This was 59 mm but he understands he would not want anything done about this so no further imaging.  Syncope: He has probably multiple things with vertigo and probably orthostatic hypotension.  He is on a low-dose of midodrine which I would continue.  He cannot really wear compression stockings.  We talked about avoiding sudden movements and sitting down immediately if he gets dizzy.  I doubt that he is having pauses but we will see this on the monitor.  I have told him that if we need to get the results from the [place] and he is going to try to make sure that this happens for [place].  At this point I will continue the meds as listed.  Current medicines are reviewed at length with the patient today.  The patient does not have concerns regarding medicines.  The following changes have been made:  no change  Labs/ tests ordered today include: None  Orders Placed This Encounter  Procedures   EKG 12-Lead     Disposition:   FU with me in 4 months     Signed, Minus Breeding, MD  03/11/2022 10:02 AM    [place]

## 2022-03-11 ENCOUNTER — Encounter: Payer: Self-pay | Admitting: Cardiology

## 2022-03-11 ENCOUNTER — Ambulatory Visit: Payer: No Typology Code available for payment source | Attending: Cardiology | Admitting: Cardiology

## 2022-03-11 VITALS — BP 100/58 | HR 80 | Ht 68.0 in | Wt 144.0 lb

## 2022-03-11 DIAGNOSIS — I482 Chronic atrial fibrillation, unspecified: Secondary | ICD-10-CM

## 2022-03-11 DIAGNOSIS — I7143 Infrarenal abdominal aortic aneurysm, without rupture: Secondary | ICD-10-CM

## 2022-03-11 NOTE — Patient Instructions (Addendum)
Medication Instructions:  Your physician recommends that you continue on your current medications as directed. Please refer to the Current Medication list given to you today.  *If you need a refill on your cardiac medications before your next appointment, please call your pharmacy*   Follow-Up: At [facility], you and your health needs are our priority.  As part of our continuing mission to provide you with exceptional heart care, we have created designated Provider Care Teams.  These Care Teams include your primary Cardiologist (physician) and Advanced Practice Providers (APPs -  Physician Assistants and Nurse Practitioners) who all work together to provide you with the care you need, when you need it.  We recommend signing up for the patient portal called "MyChart".  Sign up information is provided on this After Visit Summary.  MyChart is used to connect with patients for Virtual Visits (Telemedicine).  Patients are able to view lab/test results, encounter notes, upcoming appointments, etc.  Non-urgent messages can be sent to your provider as well.   To learn more about what you can do with MyChart, go to [email or web address].    Your next appointment:   6 month(s)  The format for your next appointment:   In Person  Provider:   Minus Breeding, MD   Other Instructions Please have monitor documentation sent to Dr. Percival Spanish when available:  [facility] [address]. [place], [place] [address] Fax: [phone]

## 2022-03-18 ENCOUNTER — Telehealth: Payer: Self-pay | Admitting: Cardiology

## 2022-03-18 NOTE — Telephone Encounter (Signed)
Left message orders per Dr. Percival Spanish: patient can have physical therapy once a week X 4 weeks; then every other week X 4 weeks.

## 2022-03-18 NOTE — Telephone Encounter (Signed)
Left message for Phillip West to call back regarding PT orders.

## 2022-03-18 NOTE — Telephone Encounter (Signed)
Spoke with Tillie Rung from [facility] who asked for PT orders. She wants patient to have physical therapy once a week X 4 weeks; then every other week X 4 weeks. Shall I give her the verbal order for PT?

## 2022-03-18 NOTE — Telephone Encounter (Signed)
Tillie Rung, was calling for verbal orders for physical therapy for home health.

## 2023-01-11 ENCOUNTER — Encounter: Payer: Self-pay | Admitting: Plastic Surgery

## 2023-01-11 ENCOUNTER — Ambulatory Visit (INDEPENDENT_AMBULATORY_CARE_PROVIDER_SITE_OTHER): Payer: No Typology Code available for payment source | Admitting: Plastic Surgery

## 2023-01-11 VITALS — BP 124/73 | HR 78

## 2023-01-11 DIAGNOSIS — H02409 Unspecified ptosis of unspecified eyelid: Secondary | ICD-10-CM | POA: Insufficient documentation

## 2023-01-11 DIAGNOSIS — H02401 Unspecified ptosis of right eyelid: Secondary | ICD-10-CM | POA: Diagnosis not present

## 2023-01-11 NOTE — Progress Notes (Signed)
Patient ID: Phillip West, male    DOB: 1929/08/08, 89 y.o.   MRN: [id number]   Chief Complaint  Patient presents with   Advice Only    The patient is a very pleasant 89 year old male here for evaluation of his eyelids.  It appears that he was sent for the ptosis of his right upper eyelid.  The patient said he was not really sure why he had been sent.  He knows that the eyelid is droopy but it does not really seem to bother him right now.  He is not very eager to have surgery.  We had a discussion about the chance that this could get worse pretty rapidly and hinder his vision.  He says right now he sees what he wants to see.  He does smoke daily.    Review of Systems  Constitutional:  Positive for activity change.  Eyes:  Positive for redness.  Respiratory: Negative.    Cardiovascular: Negative.   Gastrointestinal: Negative.   Genitourinary: Negative.     Past Medical History:  Diagnosis Date   Abdominal aortic aneurysm (AAA) (HCC)    Atrial fibrillation (HCC)    Chalazion right lower eyelid    COPD (chronic obstructive pulmonary disease) (HCC)    Diabetes mellitus (HCC)    Gross hematuria    Hypertension    Syncope and collapse    Thoracic aortic aneurysm (TAA) (HCC)     Past Surgical History:  Procedure Laterality Date   HERNIA REPAIR     INTRAMEDULLARY (IM) NAIL INTERTROCHANTERIC Left 04/26/2020   Procedure: INTRAMEDULLARY (IM) NAIL INTERTROCHANTRIC;  Surgeon: Terance Hart, MD;  Location: [facility] OR;  Service: Orthopedics;  Laterality: Left;   INTRAMEDULLARY (IM) NAIL INTERTROCHANTERIC Right 02/16/2022   Procedure: INTRAMEDULLARY (IM) NAIL INTERTROCHANTERIC;  Surgeon: Ernestina Columbia, MD;  Location: [facility] OR;  Service: Orthopedics;  Laterality: Right;   KNEE ARTHROSCOPY        Current Outpatient Medications:    acetaminophen (TYLENOL) 325 MG tablet, Take 2 tablets (650 mg total) by mouth every 6 (six) hours. (Patient not taking: Reported on 03/11/2022), Disp: ,  Rfl:    albuterol (VENTOLIN HFA) 108 (90 Base) MCG/ACT inhaler, Inhale 1 puff into the lungs every 6 (six) hours as needed for wheezing or shortness of breath., Disp: , Rfl:    amiodarone (PACERONE) 200 MG tablet, Take 1 tablet (200 mg total) by mouth daily., Disp: , Rfl:    Cholecalciferol (VITAMIN D) 50 MCG (2000 UT) tablet, Take 1 tablet by mouth daily., Disp: , Rfl:    cyanocobalamin (VITAMIN B12) 500 MCG tablet, Take 1 tablet (500 mcg total) by mouth daily., Disp: 30 tablet, Rfl: 0   digoxin 62.5 MCG TABS, Take 0.0625 mg by mouth daily., Disp: , Rfl:    diltiazem (CARDIZEM) 30 MG tablet, Take 1 tablet (30 mg total) by mouth 2 (two) times daily. (Patient not taking: Reported on 03/11/2022), Disp: , Rfl:    enoxaparin (LOVENOX) 40 MG/0.4ML injection, Inject 0.4 mLs (40 mg total) into the skin daily., Disp: 0 mL, Rfl:    LUBRICATING PLUS EYE DROPS 0.5 % SOLN, Place 1 drop into both eyes daily., Disp: , Rfl:    midodrine (PROAMATINE) 5 MG tablet, Take 1 tablet (5 mg total) by mouth 3 (three) times daily with meals., Disp: 90 tablet, Rfl: 0   pravastatin (PRAVACHOL) 40 MG tablet, Take 1 tablet by mouth daily., Disp: , Rfl:    senna (SENOKOT) 8.6 MG TABS tablet,  Take 1 tablet (8.6 mg total) by mouth daily., Disp: 120 tablet, Rfl: 0   SYMBICORT 160-4.5 MCG/ACT inhaler, Inhale 2 puffs into the lungs 2 (two) times daily., Disp: , Rfl:    tamsulosin (FLOMAX) 0.4 MG CAPS capsule, Take 0.4 mg by mouth daily., Disp: , Rfl:    Objective:   Vitals:   01/11/23 1514  BP: 124/73  Pulse: 78  SpO2: 95%    Physical Exam Constitutional:      Appearance: Normal appearance.  Cardiovascular:     Rate and Rhythm: Normal rate.     Pulses: Normal pulses.  Skin:    Capillary Refill: Capillary refill takes less than 2 seconds.  Neurological:     Mental Status: He is alert and oriented to person, place, and time.  Psychiatric:        Mood and Affect: Mood normal.        Behavior: Behavior normal.         Thought Content: Thought content normal.        Judgment: Judgment normal.     Assessment & Plan:  Ptosis of right eyelid  I think the patient would benefit greatly from a upper lid ptosis repair.  He said he will let us know if he wants to have it done.  I think that Dr. Dimas Millin would be the best 1 for the job but we did not go into detail about that since he does not want to do it right now.  Pictures were obtained of the patient and placed in the chart with the patient's or guardian's permission.   Alena Bills Kristin Lamagna, DO
